# Patient Record
Sex: Female | Born: 1950
Health system: Southern US, Community
[De-identification: ages and names within clinical notes are randomized; demographics above are authoritative.]

## PROBLEM LIST (undated history)

## (undated) DIAGNOSIS — G20 Parkinson's disease: Secondary | ICD-10-CM

## (undated) DIAGNOSIS — F329 Major depressive disorder, single episode, unspecified: Secondary | ICD-10-CM

## (undated) DIAGNOSIS — G4730 Sleep apnea, unspecified: Secondary | ICD-10-CM

## (undated) DIAGNOSIS — G20A1 Parkinson's disease without dyskinesia, without mention of fluctuations: Secondary | ICD-10-CM

## (undated) DIAGNOSIS — E119 Type 2 diabetes mellitus without complications: Secondary | ICD-10-CM

## (undated) DIAGNOSIS — I10 Essential (primary) hypertension: Secondary | ICD-10-CM

## (undated) DIAGNOSIS — Z90710 Acquired absence of both cervix and uterus: Secondary | ICD-10-CM

## (undated) DIAGNOSIS — C801 Malignant (primary) neoplasm, unspecified: Secondary | ICD-10-CM

## (undated) DIAGNOSIS — R609 Edema, unspecified: Secondary | ICD-10-CM

## (undated) DIAGNOSIS — F32A Depression, unspecified: Secondary | ICD-10-CM

## (undated) DIAGNOSIS — R600 Localized edema: Secondary | ICD-10-CM

## (undated) DIAGNOSIS — J45909 Unspecified asthma, uncomplicated: Secondary | ICD-10-CM

## (undated) HISTORY — DX: Sleep apnea, unspecified: G47.30

## (undated) HISTORY — DX: Malignant (primary) neoplasm, unspecified: C80.1

## (undated) HISTORY — DX: Depression, unspecified: F32.A

## (undated) HISTORY — PX: TONSILLECTOMY: SUR1361

## (undated) HISTORY — DX: Type 2 diabetes mellitus without complications: E11.9

## (undated) HISTORY — PX: BREAST LUMPECTOMY: SHX2

## (undated) HISTORY — DX: Localized edema: R60.0

## (undated) HISTORY — DX: Acquired absence of both cervix and uterus: Z90.710

## (undated) HISTORY — PX: OTHER SURGICAL HISTORY: SHX169

## (undated) HISTORY — DX: Major depressive disorder, single episode, unspecified: F32.9

## (undated) HISTORY — DX: Unspecified asthma, uncomplicated: J45.909

## (undated) HISTORY — DX: Edema, unspecified: R60.9

## (undated) HISTORY — DX: Parkinson's disease without dyskinesia, without mention of fluctuations: G20.A1

## (undated) HISTORY — DX: Essential (primary) hypertension: I10

## (undated) HISTORY — DX: Parkinson's disease: G20

---

## 2016-05-07 DIAGNOSIS — C50919 Malignant neoplasm of unspecified site of unspecified female breast: Secondary | ICD-10-CM | POA: Insufficient documentation

## 2018-12-21 DIAGNOSIS — Z853 Personal history of malignant neoplasm of breast: Secondary | ICD-10-CM | POA: Diagnosis not present

## 2018-12-21 DIAGNOSIS — R531 Weakness: Secondary | ICD-10-CM | POA: Diagnosis not present

## 2018-12-21 DIAGNOSIS — M79671 Pain in right foot: Secondary | ICD-10-CM | POA: Diagnosis not present

## 2018-12-21 DIAGNOSIS — N3000 Acute cystitis without hematuria: Secondary | ICD-10-CM | POA: Diagnosis not present

## 2018-12-21 DIAGNOSIS — R0902 Hypoxemia: Secondary | ICD-10-CM | POA: Diagnosis not present

## 2018-12-21 DIAGNOSIS — E860 Dehydration: Secondary | ICD-10-CM | POA: Diagnosis not present

## 2018-12-21 DIAGNOSIS — B9689 Other specified bacterial agents as the cause of diseases classified elsewhere: Secondary | ICD-10-CM | POA: Diagnosis not present

## 2018-12-21 DIAGNOSIS — R279 Unspecified lack of coordination: Secondary | ICD-10-CM | POA: Diagnosis not present

## 2018-12-21 DIAGNOSIS — Z743 Need for continuous supervision: Secondary | ICD-10-CM | POA: Diagnosis not present

## 2018-12-21 DIAGNOSIS — R5381 Other malaise: Secondary | ICD-10-CM | POA: Diagnosis not present

## 2018-12-21 DIAGNOSIS — I10 Essential (primary) hypertension: Secondary | ICD-10-CM | POA: Diagnosis not present

## 2019-01-19 DIAGNOSIS — G20 Parkinson's disease: Secondary | ICD-10-CM | POA: Diagnosis not present

## 2019-01-19 DIAGNOSIS — I10 Essential (primary) hypertension: Secondary | ICD-10-CM | POA: Diagnosis not present

## 2019-01-19 DIAGNOSIS — C50919 Malignant neoplasm of unspecified site of unspecified female breast: Secondary | ICD-10-CM | POA: Diagnosis not present

## 2019-03-05 ENCOUNTER — Encounter: Payer: Self-pay | Admitting: *Deleted

## 2019-03-19 ENCOUNTER — Ambulatory Visit: Payer: Self-pay | Admitting: Neurology

## 2019-04-03 NOTE — Progress Notes (Signed)
Stephanie Anderson was seen today in the movement disorders clinic for neurologic consultation at the request of Ronita Hipps, MD.  The consultation is for the evaluation of Parkinson's disease.  Patient was apparently previously cared for in [place], but no records are available from prior neurologist.  Patient reports that she was diagnosed with Parkinson's disease about 1.5 years years ago.   Reports that first symptom was tremor in "whole body."  She states that she went to a neurologist in [place] ([facility]) and she was dx with PD.  She was placed on a pill first that caused diarrhea and abdominal pain (not levodopa) - states was taking it once per day for a week.  She was then changed to ropinirole.  She has been on it ever since.    She is currently on ropinirole, 2 mg 3 times per day.  States that physician tried to change her to requip XL, 4 mg but insurance denied it.   Specific Symptoms:  Tremor: Yes.   Family hx of similar:  Yes.  , father with PD Voice: no change Sleep: has OSAS - has cpap but doesn't wear it as "mask too confining"  Vivid Dreams:  No.  Acting out dreams:  No. Wet Pillows: a little bit Postural symptoms:  "my balance is pretty good."  Uses cane most of the time.  Falls?  No. Bradykinesia symptoms: no bradykinesia noted Loss of smell:  No. Loss of taste:  No. Urinary Incontinence:  Yes.  , wears disposable underwear just in case she doesn't make it Difficulty Swallowing:  No. Handwriting, micrographia: "sometimes it is small" Trouble with ADL's:  No.  Trouble buttoning clothing: No. Depression:  No. Memory changes:  Mild (sister puts meds in pill box; pt doesn't drive but never has; lives with sister so sister does bills) Hallucinations:  No.  visual distortions: No. N/V:  No. Lightheaded:  No.  Syncope: No. Diplopia:  No. Dyskinesia:  No. Prior exposure to reglan/antipsychotics: No.  Neuroimaging of the brain has previously  been performed. It is not available to me.  She thinks that she had a CT brain several years ago  PREVIOUS MEDICATIONS: Requip  ALLERGIES:   Allergies  Allergen Reactions   Lasix [Furosemide]     Abdominal pain    CURRENT MEDICATIONS:  Outpatient Encounter Medications as of 04/07/2019  Medication Sig   anastrozole (ARIMIDEX) 1 MG tablet Take 1 mg by mouth daily. 1 oral every morning.   aspirin EC 81 MG tablet Take 81 mg by mouth daily.   atenolol (TENORMIN) 25 MG tablet Take by mouth daily. 1 tab every morning   bimatoprost (LUMIGAN) 0.01 % SOLN 1 drop at bedtime. 1 drop each eye every night   hydrochlorothiazide (HYDRODIURIL) 50 MG tablet Take 50 mg by mouth daily. 1 tablet every morning   LOSARTAN POTASSIUM PO Take 50 mg by mouth. 1 tab every morning   metFORMIN (GLUCOPHAGE) 1000 MG tablet Take 1,000 mg by mouth 2 (two) times daily with a meal. 1 tab by mouth twice daily   Multiple Vitamins-Minerals (MULTIVITAMIN ADULT PO) Take by mouth. Vit.C, D3, fish oil, calcium, iron, b12   Omega-3 Fatty Acids (FISH OIL) 1000 MG CAPS Take by mouth.   rOPINIRole (REQUIP) 2 MG tablet Take 2 mg by mouth at bedtime. 1 tab three times daily   sertraline (ZOLOFT) 50 MG tablet Take 50 mg by mouth daily. 1 tab every evening.   simvastatin (ZOCOR)  20 MG tablet Take 20 mg by mouth daily. 1 oral every evening   No facility-administered encounter medications on file as of 04/07/2019.     PAST MEDICAL HISTORY:   Past Medical History:  Diagnosis Date   breast cancer    Depression    Diabetes mellitus without complication (HCC)    H/O: hysterectomy    Hypertension    Parkinson's disease ([place])    Peripheral edema    Sleep apnea     PAST SURGICAL HISTORY:   Past Surgical History:  Procedure Laterality Date   BREAST LUMPECTOMY     left knee replacement     TONSILLECTOMY      SOCIAL HISTORY:   Social History   Socioeconomic History   Marital status: Single    Spouse  name: Not on file   Number of children: Not on file   Years of education: Not on file   Highest education level: Not on file  Occupational History   Occupation: RETIRED  Social Designer, fashion/clothing strain: Not on file   Food insecurity:    Worry: Not on file    Inability: Not on file   Transportation needs:    Medical: Not on file    Non-medical: Not on file  Tobacco Use   Smoking status: Never Smoker   Smokeless tobacco: Never Used  Substance and Sexual Activity   Alcohol use: Never    Frequency: Never   Drug use: Never   Sexual activity: Not on file  Lifestyle   Physical activity:    Days per week: Not on file    Minutes per session: Not on file   Stress: Not on file  Relationships   Social connections:    Talks on phone: Not on file    Gets together: Not on file    Attends religious service: Not on file    Active member of club or organization: Not on file    Attends meetings of clubs or organizations: Not on file    Relationship status: Not on file   Intimate partner violence:    Fear of current or ex partner: Not on file    Emotionally abused: Not on file    Physically abused: Not on file    Forced sexual activity: Not on file  Other Topics Concern   Not on file  Social History Narrative   Not on file    FAMILY HISTORY:   Family Status  Relation Name Status   Mother  Deceased   Father  Deceased   Sister  Alive    ROS:  Review of Systems  Constitutional: Negative.   HENT: Negative.   Eyes: Negative.   Respiratory: Negative.   Cardiovascular: Negative.   Gastrointestinal: Negative.   Genitourinary: Negative.   Musculoskeletal: Negative.   Skin: Negative.   Endo/Heme/Allergies: Negative.     PHYSICAL EXAMINATION:    VITALS:   Vitals:   04/07/19 0918  BP: (!) 142/85  Pulse: 85  Temp: 98.4 F (36.9 C)  SpO2: 95%  Weight: 231 lb 6.4 oz (105 kg)  Height: 5\' 6"  (1.676 m)    GEN:  The patient appears stated age  and is in NAD. HEENT:  Normocephalic, atraumatic.  The mucous membranes are moist. The superficial temporal arteries are without ropiness or tenderness. CV:  RRR Lungs:  CTAB Neck/HEME:  There are no carotid bruits bilaterally.  Neurological examination:  Orientation: The patient is alert and oriented x3. Fund of knowledge  is appropriate.  Recent and remote memory are intact.  Attention and concentration are normal.    Able to name objects and repeat phrases. Cranial nerves: There is good facial symmetry.  There is no facial hypomania.  Extraocular muscles are intact. The visual fields are full to confrontational testing. The speech is fluent and clear. Soft palate rises symmetrically and there is no tongue deviation. Hearing is intact to conversational tone. Sensation: Sensation is intact to light and pinprick throughout (facial, trunk, extremities). Vibration is intact at the bilateral big toe. There is no extinction with double simultaneous stimulation. There is no sensory dermatomal level identified. Motor: Strength is 5/5 in the bilateral upper and lower extremities.   Shoulder shrug is equal and symmetric.  There is no pronator drift. Deep tendon reflexes: not tested  Movement examination: Tone: There is normal tone in the UE/LE Abnormal movements: there is RUE rest tremor that is distractible.  It varies in amplitude and frequency.  It changes when asked to tap out a beat with the opposite hand and will match the beat being asked to tap out. Coordination:  There is no decremation with RAM's, with any form of RAMS, including alternating supination and pronation of the forearm, hand opening and closing, finger taps, heel taps and toe taps bilaterally Gait and Station: The patient has no difficulty arising out of a deep-seated chair without the use of the hands. The patient's stride length is normal, but slow.  She walks with the cane in the right hand.    I have reviewed lab work made  available to me.  It was done December 21, 2018.  White blood cells were 9.2, hemoglobin 10.6, hematocrit 31.9 and platelets 168.  Sodium was 136, potassium 3.9, chloride 94, CO2 27, BUN 27, creatinine 1.6, glucose 129.  ASSESSMENT/PLAN:  1.  Rest tremor, with prior diagnosis of Parkinson's disease  -Long discussion with the patient today.  She currently does not meet [place] brain bank criteria for the diagnosis of Parkinson's disease, and she has not taken her ropinirole today.  I am concerned that the patient potentially has a misdiagnosis.  Her tremor was distractible.  I think a DaTscan could potentially be of value, and she was agreeable.  We discussed that this is not a diagnostic or therapeutic scan.  I am going to have her hold her sertraline for 10 days prior to the test.  She actually states that she has no depression, and was started on it because her sister told the physician that she was depressed, even though she denied it.  She will restart the sertraline following the scan.  She was given patient education on the [place].  -Patient's sister asked me to fill out her handicap placard form, but given the questionable diagnosis, I really did not feel comfortable doing so and did not fill that out today.  -I will try to get a copy of the patient's prior neurology records from [place].  2.  Sleep apnea  -Patient currently not wearing CPAP.  Discussed that she really needs to get back to doing so on a nightly basis.  She has been off of it for about a month.  3.  We will see her back in follow-up after the DaTscan has been completed.  Much greater than 50% of this visit was spent in counseling and coordinating care.  Total face to face time:  45 min     Cc:  Ronita Hipps, MD

## 2019-04-07 ENCOUNTER — Ambulatory Visit (INDEPENDENT_AMBULATORY_CARE_PROVIDER_SITE_OTHER): Payer: Medicare Other | Admitting: Neurology

## 2019-04-07 ENCOUNTER — Encounter: Payer: Self-pay | Admitting: Neurology

## 2019-04-07 ENCOUNTER — Other Ambulatory Visit: Payer: Self-pay

## 2019-04-07 VITALS — BP 142/85 | HR 85 | Temp 98.4°F | Ht 66.0 in | Wt 231.4 lb

## 2019-04-07 DIAGNOSIS — R251 Tremor, unspecified: Secondary | ICD-10-CM | POA: Diagnosis not present

## 2019-04-07 DIAGNOSIS — G4733 Obstructive sleep apnea (adult) (pediatric): Secondary | ICD-10-CM | POA: Diagnosis not present

## 2019-04-07 NOTE — Patient Instructions (Signed)
We will schedule you for DaT scan.  As a reminder, a DaT scan is not a diagnostic scan but will give [place] information.  Today, you did NOT meet diagnostic/clinical criteria for the diagnosis of Parkinson's disease and you had not yet taken your medication, which is why I would like to look at the scan.  You will need to hold your sertraline for 10 days prior to the scan and restart it after the scan.  I would recommend that you wear your cpap nightly.  It was really good meeting you today!

## 2019-04-09 ENCOUNTER — Telehealth: Payer: Self-pay | Admitting: Neurology

## 2019-04-09 NOTE — Telephone Encounter (Signed)
Reviewed an extensive number of records received from patient's prior neurologist, Dr. Renaldo Reel.  Patient first saw him on April 15, 2017 with a 20-year history of progressive tremor.  Patient had already tried propranolol, 60 mg 3 times per day by the primary care physician.  Dr. Avel Peace felt that the patient had a Parkinson's disease that was a "slow progression over the past 20 years."  Patient was started on rasagiline.  This was done on May 22, 2017.  The patient did not tolerate this.  She was switched to ropinirole in July 31, 2017 and started at 1 mg 3 times per day.  She had a nocturnal polysomnogram on June 21, 2017.  Her apnea hypopnea index was 47.3.  Oxygen saturations were down to 80%.  She was started on CPAP.  She had an MRI of the brain on May 08, 2017.  She had chronic small vessel disease.  She was last seen in [place] by the physician assistant.

## 2019-04-13 ENCOUNTER — Other Ambulatory Visit: Payer: Self-pay

## 2019-04-13 DIAGNOSIS — R251 Tremor, unspecified: Secondary | ICD-10-CM

## 2019-04-13 NOTE — Progress Notes (Signed)
Theodosia Quay sent to Ranae Plumber, CMA        The order you placed was correct, I have scheduled the patient for July 2nd. I spoke with the patient.   Thanks,   [employer]

## 2019-04-13 NOTE — Progress Notes (Signed)
Received PA for patient [place] placed Order/ PA approval letter sent to [facility]  Staff message also sent as notification

## 2019-04-30 ENCOUNTER — Other Ambulatory Visit: Payer: Self-pay

## 2019-04-30 ENCOUNTER — Ambulatory Visit (HOSPITAL_COMMUNITY)
Admission: RE | Admit: 2019-04-30 | Discharge: 2019-04-30 | Disposition: A | Discharge status: Home / Self Care | Payer: Medicare Other | Source: Ambulatory Visit | Attending: Neurology | Admitting: Neurology

## 2019-04-30 ENCOUNTER — Encounter (HOSPITAL_COMMUNITY)
Admission: RE | Admit: 2019-04-30 | Discharge: 2019-04-30 | Disposition: A | Discharge status: Home / Self Care | Payer: Medicare Other | Source: Ambulatory Visit | Attending: Neurology | Admitting: Neurology

## 2019-04-30 DIAGNOSIS — R251 Tremor, unspecified: Secondary | ICD-10-CM | POA: Insufficient documentation

## 2019-04-30 MED ORDER — IOFLUPANE I 123 185 MBQ/2.5ML IV SOLN
4.7300 | Freq: Once | INTRAVENOUS | Status: AC | PRN
  Administered 2019-04-30: 12:00:00 4.73 via INTRAVENOUS
  Filled 2019-04-30: qty 5

## 2019-04-30 MED ORDER — IODINE STRONG (LUGOLS) 5 % PO SOLN
0.8000 mL | Freq: Once | ORAL | Status: AC
  Administered 2019-04-30: 0.8 mL via ORAL

## 2019-05-04 ENCOUNTER — Telehealth: Payer: Self-pay | Admitting: Neurology

## 2019-05-04 NOTE — Telephone Encounter (Signed)
Reviewed and agree with DaT scan results.  Hinton Dyer, please put her in 7/15 at 11:15 unless she wants an evisit and then can do this Friday but I personally would rather see her at the in person visit.

## 2019-05-04 NOTE — Telephone Encounter (Signed)
I called and left her a message to please call the office back to get scheduled for a follow up with Dr Tat

## 2019-05-07 NOTE — Progress Notes (Signed)
Virtual Visit via Video Note The purpose of this virtual visit is to provide medical care while limiting exposure to the novel coronavirus.    Consent was obtained for video visit:  Yes.   Answered questions that patient had about telehealth interaction:  Yes.   I discussed the limitations, risks, security and privacy concerns of performing an evaluation and management service by telemedicine. I also discussed with the patient that there may be a patient responsible charge related to this service. The patient expressed understanding and agreed to proceed.  Pt location: Home Physician Location: home Name of referring provider:  Ronita Hipps, MD I connected with Stephanie Anderson at patients initiation/request on 05/08/2019 at  1:00 PM EDT by video enabled telemedicine application and verified that I am speaking with the correct person using two identifiers. Pt MRN:  [id number] Pt DOB:  1951/08/15 Video Participants:  Stephanie Anderson;  Sister Vicente Males accompanies patient and supplements the history.   History of Present Illness:  Patient seen today for follow-up of tremor.  Patient came to me previously diagnosed with Parkinson's disease, but she did not meet the criteria for this diagnosis when I saw her last visit and I was concerned about a misdiagnosis.  She had a DaTscan completed and I had the opportunity to review this.  This was done on April 30, 2019.  DaTscan was normal, which would be inconsistent with Parkinson's disease.  She is still on Requip, 2 mg 3 times per day.  I did have the opportunity since our last visit to review an extensive number of records from her prior neurologist.  She first saw him on April 15, 2017 with a reported 20-year history of progressive tremor.  Patient had already been on propranolol, 60 mg 3 times per day.  The neurologist that she saw (Dr. Avel Peace) felt that she had Parkinson's disease that was a "slow progression over the past 20 years."  She was for started on  rasagiline but did not tolerate that.  She was changed to ropinirole in October, 2013.  Of note is that the patient did have a nocturnal polysomnogram in August, 2018.  Apnea hypopnea index was 47.3.  She was started on CPAP, the patient admits that she is not wearing it any longer.  MRI of the brain was completed on May 08, 2017 with evidence of chronic small vessel disease.   Current Outpatient Medications on File Prior to Visit  Medication Sig Dispense Refill   anastrozole (ARIMIDEX) 1 MG tablet Take 1 mg by mouth daily. 1 oral every morning.     aspirin EC 81 MG tablet Take 81 mg by mouth daily.     atenolol (TENORMIN) 25 MG tablet Take by mouth daily. 1 tab every morning     bimatoprost (LUMIGAN) 0.01 % SOLN 1 drop at bedtime. 1 drop each eye every night     hydrochlorothiazide (HYDRODIURIL) 50 MG tablet Take 50 mg by mouth daily. 1 tablet every morning     LOSARTAN POTASSIUM PO Take 50 mg by mouth. 1 tab every morning     metFORMIN (GLUCOPHAGE) 1000 MG tablet Take 1,000 mg by mouth 2 (two) times daily with a meal. 1 tab by mouth twice daily     Multiple Vitamins-Minerals (MULTIVITAMIN ADULT PO) Take by mouth. Vit.C, D3, fish oil, calcium, iron, b12     Omega-3 Fatty Acids (FISH OIL) 1000 MG CAPS Take by mouth.     rOPINIRole (REQUIP) 2 MG tablet Take  2 mg by mouth at bedtime. 1 tab three times daily     sertraline (ZOLOFT) 50 MG tablet Take 50 mg by mouth daily. 1 tab every evening.     simvastatin (ZOCOR) 20 MG tablet Take 20 mg by mouth daily. 1 oral every evening     No current facility-administered medications on file prior to visit.      Observations/Objective:   Vitals:   05/08/19 1131  Weight: 240 lb (108.9 kg)  Height: 5\' 6"  (1.676 m)   GEN:  The patient appears stated age and is in NAD.  Neurological examination:  Orientation: The patient is alert and oriented x3. Cranial nerves: There is good facial symmetry. There is no facial hypomimia.  The speech is  fluent and clear. Soft palate rises symmetrically and there is no tongue deviation. Hearing is intact to conversational tone. Motor: Strength is at least antigravity x 4.   Shoulder shrug is equal and symmetric.  There is no pronator drift.  Movement examination: Tone: unable Abnormal movements: There is head tremor that is variable in nature (variable in frequency as well as direction).  I was not able to see her hands at rest through the video.  She did not have postural or intention tremor today. Coordination:  There is no decremation with RAM's, with hand opening and closing her finger taps bilaterally. Gait and Station: The patient pushes off to arise.  She was steady today.    Assessment and Plan:   1.  Tremor  -Discussed with the patient that I do not think she has Parkinson's disease.  She does not meet the clinical criteria for the [place] brain bank criteria.  DaTscan done in July, 2020 was also normal.  My suspicion is that the patient actually has psychogenic tremor, based on the fact that last visit her tremor varied in amplitude and frequency and changed when asked to tap out a beat with the opposite hand (entrainment).  Discussed that medications generally do not help this.  Discussed that many times this is associated with past stress/trauma.  Discussed that oftentimes patients are unaware of the stress/trauma and then her sister stated "oh, we know the stress."  I would recommend counseling, and I am happy to referral, but patient sister stated that they would rather get that from the primary care physician in hopes that they would find someone closer to home.    -Patient's sister and likely father (based on history) have essential tremor, but this patients history/physical are not consistent with that and her sister also agrees that her symptoms are different.    -I would recommend weaning and discontinuing the Requip.  Patient and sister both agree with this and sister states that "the  requip has never helped."  We will decrease her Requip to 2 mg twice per day for a week, then 2 mg once per day for a week and then she will discontinue the Requip.  2.  Severe obstructive sleep apnea syndrome  -Discussed morbidity and mortality associated with untreated sleep apnea.  Patient is currently not wearing CPAP.  Addressed the fact that she needs to get back on CPAP, and she states that she will.  Sister states that she needs new tubing.  She will need referred to a home company, but the local home companies will not provide her with supplies until they see her sleep study.  Her sister is going to try to get a copy of that from [place], and then I am  happy to refer her to advanced home care.  Her sister states that they will likely get the referral from primary care.  Follow Up Instructions:  As needed follow-up  -I discussed the assessment and treatment plan with the patient. The patient was provided an opportunity to ask questions and all were answered. The patient agreed with the plan and demonstrated an understanding of the instructions.   The patient was advised to call back or seek an in-person evaluation if the symptoms worsen or if the condition fails to improve as anticipated.    Total Time spent in visit with the patient was:  25 min, of which more than 50% of the time was spent in counseling and/or coordinating care as above.   Pt understands and agrees with the plan of care outlined.     Alonza Bogus, DO

## 2019-05-08 ENCOUNTER — Other Ambulatory Visit: Payer: Self-pay

## 2019-05-08 ENCOUNTER — Encounter: Payer: Self-pay | Admitting: Neurology

## 2019-05-08 ENCOUNTER — Telehealth (INDEPENDENT_AMBULATORY_CARE_PROVIDER_SITE_OTHER): Payer: Medicare Other | Admitting: Neurology

## 2019-05-08 VITALS — Ht 66.0 in | Wt 240.0 lb

## 2019-05-08 DIAGNOSIS — F444 Conversion disorder with motor symptom or deficit: Secondary | ICD-10-CM

## 2019-05-12 DIAGNOSIS — E118 Type 2 diabetes mellitus with unspecified complications: Secondary | ICD-10-CM | POA: Diagnosis not present

## 2019-07-16 DIAGNOSIS — G252 Other specified forms of tremor: Secondary | ICD-10-CM | POA: Diagnosis not present

## 2019-07-23 DIAGNOSIS — C50411 Malignant neoplasm of upper-outer quadrant of right female breast: Secondary | ICD-10-CM | POA: Diagnosis not present

## 2019-07-23 DIAGNOSIS — D649 Anemia, unspecified: Secondary | ICD-10-CM | POA: Diagnosis not present

## 2019-07-23 DIAGNOSIS — Z79811 Long term (current) use of aromatase inhibitors: Secondary | ICD-10-CM | POA: Diagnosis not present

## 2019-07-23 DIAGNOSIS — Z853 Personal history of malignant neoplasm of breast: Secondary | ICD-10-CM | POA: Diagnosis not present

## 2019-07-23 DIAGNOSIS — R251 Tremor, unspecified: Secondary | ICD-10-CM | POA: Diagnosis not present

## 2019-08-13 DIAGNOSIS — G250 Essential tremor: Secondary | ICD-10-CM | POA: Diagnosis not present

## 2019-08-17 DIAGNOSIS — M85852 Other specified disorders of bone density and structure, left thigh: Secondary | ICD-10-CM | POA: Diagnosis not present

## 2019-08-17 DIAGNOSIS — M8589 Other specified disorders of bone density and structure, multiple sites: Secondary | ICD-10-CM | POA: Diagnosis not present

## 2019-09-10 DIAGNOSIS — E118 Type 2 diabetes mellitus with unspecified complications: Secondary | ICD-10-CM | POA: Diagnosis not present

## 2019-09-10 DIAGNOSIS — I10 Essential (primary) hypertension: Secondary | ICD-10-CM | POA: Diagnosis not present

## 2019-09-10 DIAGNOSIS — R928 Other abnormal and inconclusive findings on diagnostic imaging of breast: Secondary | ICD-10-CM | POA: Diagnosis not present

## 2019-09-10 DIAGNOSIS — C50411 Malignant neoplasm of upper-outer quadrant of right female breast: Secondary | ICD-10-CM | POA: Diagnosis not present

## 2019-09-10 DIAGNOSIS — Z23 Encounter for immunization: Secondary | ICD-10-CM | POA: Diagnosis not present

## 2019-09-10 DIAGNOSIS — Z0000 Encounter for general adult medical examination without abnormal findings: Secondary | ICD-10-CM | POA: Diagnosis not present

## 2019-11-12 DIAGNOSIS — G250 Essential tremor: Secondary | ICD-10-CM | POA: Diagnosis not present

## 2019-11-12 DIAGNOSIS — R5383 Other fatigue: Secondary | ICD-10-CM | POA: Diagnosis not present

## 2019-12-10 DIAGNOSIS — Z79899 Other long term (current) drug therapy: Secondary | ICD-10-CM | POA: Diagnosis not present

## 2019-12-10 DIAGNOSIS — E538 Deficiency of other specified B group vitamins: Secondary | ICD-10-CM | POA: Diagnosis not present

## 2019-12-10 DIAGNOSIS — G250 Essential tremor: Secondary | ICD-10-CM | POA: Diagnosis not present

## 2019-12-10 DIAGNOSIS — R5383 Other fatigue: Secondary | ICD-10-CM | POA: Diagnosis not present

## 2019-12-10 DIAGNOSIS — E559 Vitamin D deficiency, unspecified: Secondary | ICD-10-CM | POA: Diagnosis not present

## 2019-12-24 DIAGNOSIS — G250 Essential tremor: Secondary | ICD-10-CM | POA: Diagnosis not present

## 2020-01-06 DIAGNOSIS — T50Z95A Adverse effect of other vaccines and biological substances, initial encounter: Secondary | ICD-10-CM | POA: Diagnosis not present

## 2020-01-06 DIAGNOSIS — I10 Essential (primary) hypertension: Secondary | ICD-10-CM | POA: Diagnosis not present

## 2020-01-06 DIAGNOSIS — Z743 Need for continuous supervision: Secondary | ICD-10-CM | POA: Diagnosis not present

## 2020-01-06 DIAGNOSIS — T881XXA Other complications following immunization, not elsewhere classified, initial encounter: Secondary | ICD-10-CM | POA: Diagnosis not present

## 2020-01-06 DIAGNOSIS — R0789 Other chest pain: Secondary | ICD-10-CM | POA: Diagnosis not present

## 2020-01-06 DIAGNOSIS — I499 Cardiac arrhythmia, unspecified: Secondary | ICD-10-CM | POA: Diagnosis not present

## 2020-01-06 DIAGNOSIS — R079 Chest pain, unspecified: Secondary | ICD-10-CM | POA: Diagnosis not present

## 2020-01-21 DIAGNOSIS — G252 Other specified forms of tremor: Secondary | ICD-10-CM | POA: Diagnosis not present

## 2020-01-26 DIAGNOSIS — E119 Type 2 diabetes mellitus without complications: Secondary | ICD-10-CM | POA: Diagnosis not present

## 2020-02-19 DIAGNOSIS — E118 Type 2 diabetes mellitus with unspecified complications: Secondary | ICD-10-CM | POA: Diagnosis not present

## 2020-02-25 DIAGNOSIS — E118 Type 2 diabetes mellitus with unspecified complications: Secondary | ICD-10-CM | POA: Diagnosis not present

## 2020-02-26 DIAGNOSIS — I10 Essential (primary) hypertension: Secondary | ICD-10-CM | POA: Diagnosis not present

## 2020-02-26 DIAGNOSIS — E7800 Pure hypercholesterolemia, unspecified: Secondary | ICD-10-CM | POA: Diagnosis not present

## 2020-03-17 DIAGNOSIS — G252 Other specified forms of tremor: Secondary | ICD-10-CM | POA: Diagnosis not present

## 2020-03-21 DIAGNOSIS — G4733 Obstructive sleep apnea (adult) (pediatric): Secondary | ICD-10-CM | POA: Diagnosis not present

## 2020-03-21 DIAGNOSIS — R0600 Dyspnea, unspecified: Secondary | ICD-10-CM | POA: Diagnosis not present

## 2020-03-21 DIAGNOSIS — R5383 Other fatigue: Secondary | ICD-10-CM | POA: Diagnosis not present

## 2020-03-28 DIAGNOSIS — E118 Type 2 diabetes mellitus with unspecified complications: Secondary | ICD-10-CM | POA: Diagnosis not present

## 2020-03-28 DIAGNOSIS — I10 Essential (primary) hypertension: Secondary | ICD-10-CM | POA: Diagnosis not present

## 2020-04-02 DIAGNOSIS — G4733 Obstructive sleep apnea (adult) (pediatric): Secondary | ICD-10-CM | POA: Diagnosis not present

## 2020-04-05 DIAGNOSIS — R5383 Other fatigue: Secondary | ICD-10-CM | POA: Diagnosis not present

## 2020-04-05 DIAGNOSIS — G4733 Obstructive sleep apnea (adult) (pediatric): Secondary | ICD-10-CM | POA: Diagnosis not present

## 2020-04-05 DIAGNOSIS — R0600 Dyspnea, unspecified: Secondary | ICD-10-CM | POA: Diagnosis not present

## 2020-04-06 DIAGNOSIS — Z79899 Other long term (current) drug therapy: Secondary | ICD-10-CM | POA: Diagnosis not present

## 2020-04-06 DIAGNOSIS — E7800 Pure hypercholesterolemia, unspecified: Secondary | ICD-10-CM | POA: Diagnosis not present

## 2020-04-06 DIAGNOSIS — E119 Type 2 diabetes mellitus without complications: Secondary | ICD-10-CM | POA: Diagnosis not present

## 2020-04-14 DIAGNOSIS — Z0000 Encounter for general adult medical examination without abnormal findings: Secondary | ICD-10-CM | POA: Diagnosis not present

## 2020-04-14 DIAGNOSIS — E118 Type 2 diabetes mellitus with unspecified complications: Secondary | ICD-10-CM | POA: Diagnosis not present

## 2020-04-23 DIAGNOSIS — G4733 Obstructive sleep apnea (adult) (pediatric): Secondary | ICD-10-CM | POA: Diagnosis not present

## 2020-04-27 DIAGNOSIS — H40013 Open angle with borderline findings, low risk, bilateral: Secondary | ICD-10-CM | POA: Diagnosis not present

## 2020-04-27 DIAGNOSIS — I10 Essential (primary) hypertension: Secondary | ICD-10-CM | POA: Diagnosis not present

## 2020-04-27 DIAGNOSIS — E118 Type 2 diabetes mellitus with unspecified complications: Secondary | ICD-10-CM | POA: Diagnosis not present

## 2020-05-02 DIAGNOSIS — R0600 Dyspnea, unspecified: Secondary | ICD-10-CM | POA: Diagnosis not present

## 2020-05-02 DIAGNOSIS — R5383 Other fatigue: Secondary | ICD-10-CM | POA: Diagnosis not present

## 2020-05-02 DIAGNOSIS — J45998 Other asthma: Secondary | ICD-10-CM | POA: Diagnosis not present

## 2020-05-02 DIAGNOSIS — G4733 Obstructive sleep apnea (adult) (pediatric): Secondary | ICD-10-CM | POA: Diagnosis not present

## 2020-05-11 DIAGNOSIS — G252 Other specified forms of tremor: Secondary | ICD-10-CM | POA: Diagnosis not present

## 2020-05-12 DIAGNOSIS — L602 Onychogryphosis: Secondary | ICD-10-CM | POA: Diagnosis not present

## 2020-05-12 DIAGNOSIS — Z7984 Long term (current) use of oral hypoglycemic drugs: Secondary | ICD-10-CM | POA: Diagnosis not present

## 2020-05-12 DIAGNOSIS — E119 Type 2 diabetes mellitus without complications: Secondary | ICD-10-CM | POA: Diagnosis not present

## 2020-05-28 DIAGNOSIS — I10 Essential (primary) hypertension: Secondary | ICD-10-CM | POA: Diagnosis not present

## 2020-05-28 DIAGNOSIS — E119 Type 2 diabetes mellitus without complications: Secondary | ICD-10-CM | POA: Diagnosis not present

## 2020-06-26 DIAGNOSIS — I499 Cardiac arrhythmia, unspecified: Secondary | ICD-10-CM | POA: Diagnosis not present

## 2020-06-26 DIAGNOSIS — R1084 Generalized abdominal pain: Secondary | ICD-10-CM | POA: Diagnosis not present

## 2020-06-26 DIAGNOSIS — R6889 Other general symptoms and signs: Secondary | ICD-10-CM | POA: Diagnosis not present

## 2020-06-26 DIAGNOSIS — R109 Unspecified abdominal pain: Secondary | ICD-10-CM | POA: Diagnosis not present

## 2020-06-26 DIAGNOSIS — R1110 Vomiting, unspecified: Secondary | ICD-10-CM | POA: Diagnosis not present

## 2020-06-26 DIAGNOSIS — G20 Parkinson's disease: Secondary | ICD-10-CM | POA: Diagnosis not present

## 2020-06-26 DIAGNOSIS — E162 Hypoglycemia, unspecified: Secondary | ICD-10-CM | POA: Diagnosis not present

## 2020-06-26 DIAGNOSIS — Z7984 Long term (current) use of oral hypoglycemic drugs: Secondary | ICD-10-CM | POA: Diagnosis not present

## 2020-06-26 DIAGNOSIS — R112 Nausea with vomiting, unspecified: Secondary | ICD-10-CM | POA: Diagnosis not present

## 2020-06-26 DIAGNOSIS — K5730 Diverticulosis of large intestine without perforation or abscess without bleeding: Secondary | ICD-10-CM | POA: Diagnosis not present

## 2020-06-26 DIAGNOSIS — Z79899 Other long term (current) drug therapy: Secondary | ICD-10-CM | POA: Diagnosis not present

## 2020-06-26 DIAGNOSIS — Z743 Need for continuous supervision: Secondary | ICD-10-CM | POA: Diagnosis not present

## 2020-06-26 DIAGNOSIS — J90 Pleural effusion, not elsewhere classified: Secondary | ICD-10-CM | POA: Diagnosis not present

## 2020-06-26 DIAGNOSIS — I10 Essential (primary) hypertension: Secondary | ICD-10-CM | POA: Diagnosis not present

## 2020-06-26 DIAGNOSIS — E161 Other hypoglycemia: Secondary | ICD-10-CM | POA: Diagnosis not present

## 2020-06-26 DIAGNOSIS — I700 Atherosclerosis of aorta: Secondary | ICD-10-CM | POA: Diagnosis not present

## 2020-06-26 DIAGNOSIS — R197 Diarrhea, unspecified: Secondary | ICD-10-CM | POA: Diagnosis not present

## 2020-06-26 DIAGNOSIS — R531 Weakness: Secondary | ICD-10-CM | POA: Diagnosis not present

## 2020-06-26 DIAGNOSIS — E119 Type 2 diabetes mellitus without complications: Secondary | ICD-10-CM | POA: Diagnosis not present

## 2020-06-26 DIAGNOSIS — Z7982 Long term (current) use of aspirin: Secondary | ICD-10-CM | POA: Diagnosis not present

## 2020-06-28 DIAGNOSIS — I10 Essential (primary) hypertension: Secondary | ICD-10-CM | POA: Diagnosis not present

## 2020-06-28 DIAGNOSIS — E118 Type 2 diabetes mellitus with unspecified complications: Secondary | ICD-10-CM | POA: Diagnosis not present

## 2020-06-29 DIAGNOSIS — Z9189 Other specified personal risk factors, not elsewhere classified: Secondary | ICD-10-CM | POA: Diagnosis not present

## 2020-06-29 DIAGNOSIS — G252 Other specified forms of tremor: Secondary | ICD-10-CM | POA: Diagnosis not present

## 2020-07-01 DIAGNOSIS — K591 Functional diarrhea: Secondary | ICD-10-CM | POA: Diagnosis not present

## 2020-07-07 DIAGNOSIS — G4733 Obstructive sleep apnea (adult) (pediatric): Secondary | ICD-10-CM | POA: Diagnosis not present

## 2020-07-07 DIAGNOSIS — G4710 Hypersomnia, unspecified: Secondary | ICD-10-CM | POA: Diagnosis not present

## 2020-07-08 DIAGNOSIS — R197 Diarrhea, unspecified: Secondary | ICD-10-CM | POA: Diagnosis not present

## 2020-07-11 DIAGNOSIS — Z79899 Other long term (current) drug therapy: Secondary | ICD-10-CM | POA: Diagnosis not present

## 2020-07-11 DIAGNOSIS — E118 Type 2 diabetes mellitus with unspecified complications: Secondary | ICD-10-CM | POA: Diagnosis not present

## 2020-07-20 DIAGNOSIS — G252 Other specified forms of tremor: Secondary | ICD-10-CM | POA: Diagnosis not present

## 2020-07-28 DIAGNOSIS — I10 Essential (primary) hypertension: Secondary | ICD-10-CM | POA: Diagnosis not present

## 2020-07-28 DIAGNOSIS — E118 Type 2 diabetes mellitus with unspecified complications: Secondary | ICD-10-CM | POA: Diagnosis not present

## 2020-08-06 DIAGNOSIS — G4733 Obstructive sleep apnea (adult) (pediatric): Secondary | ICD-10-CM | POA: Diagnosis not present

## 2020-08-08 DIAGNOSIS — G4710 Hypersomnia, unspecified: Secondary | ICD-10-CM | POA: Diagnosis not present

## 2020-08-08 DIAGNOSIS — G4733 Obstructive sleep apnea (adult) (pediatric): Secondary | ICD-10-CM | POA: Diagnosis not present

## 2020-08-17 DIAGNOSIS — G252 Other specified forms of tremor: Secondary | ICD-10-CM | POA: Diagnosis not present

## 2020-08-18 DIAGNOSIS — R0600 Dyspnea, unspecified: Secondary | ICD-10-CM | POA: Diagnosis not present

## 2020-08-18 DIAGNOSIS — G4733 Obstructive sleep apnea (adult) (pediatric): Secondary | ICD-10-CM | POA: Diagnosis not present

## 2020-08-18 DIAGNOSIS — R5383 Other fatigue: Secondary | ICD-10-CM | POA: Diagnosis not present

## 2020-08-18 DIAGNOSIS — Z23 Encounter for immunization: Secondary | ICD-10-CM | POA: Diagnosis not present

## 2020-08-27 DIAGNOSIS — I10 Essential (primary) hypertension: Secondary | ICD-10-CM | POA: Diagnosis not present

## 2020-08-27 DIAGNOSIS — E118 Type 2 diabetes mellitus with unspecified complications: Secondary | ICD-10-CM | POA: Diagnosis not present

## 2020-09-06 DIAGNOSIS — G4733 Obstructive sleep apnea (adult) (pediatric): Secondary | ICD-10-CM | POA: Diagnosis not present

## 2020-09-07 DIAGNOSIS — G4733 Obstructive sleep apnea (adult) (pediatric): Secondary | ICD-10-CM | POA: Diagnosis not present

## 2020-09-07 DIAGNOSIS — G4710 Hypersomnia, unspecified: Secondary | ICD-10-CM | POA: Diagnosis not present

## 2020-09-13 DIAGNOSIS — E119 Type 2 diabetes mellitus without complications: Secondary | ICD-10-CM | POA: Diagnosis not present

## 2020-09-13 DIAGNOSIS — L84 Corns and callosities: Secondary | ICD-10-CM | POA: Diagnosis not present

## 2020-09-13 DIAGNOSIS — L600 Ingrowing nail: Secondary | ICD-10-CM | POA: Diagnosis not present

## 2020-09-21 DIAGNOSIS — R928 Other abnormal and inconclusive findings on diagnostic imaging of breast: Secondary | ICD-10-CM | POA: Diagnosis not present

## 2020-09-21 DIAGNOSIS — C50411 Malignant neoplasm of upper-outer quadrant of right female breast: Secondary | ICD-10-CM | POA: Diagnosis not present

## 2020-09-29 DIAGNOSIS — G252 Other specified forms of tremor: Secondary | ICD-10-CM | POA: Diagnosis not present

## 2020-10-06 DIAGNOSIS — G4733 Obstructive sleep apnea (adult) (pediatric): Secondary | ICD-10-CM | POA: Diagnosis not present

## 2020-10-12 DIAGNOSIS — H40013 Open angle with borderline findings, low risk, bilateral: Secondary | ICD-10-CM | POA: Diagnosis not present

## 2020-10-13 DIAGNOSIS — R0600 Dyspnea, unspecified: Secondary | ICD-10-CM | POA: Diagnosis not present

## 2020-10-13 DIAGNOSIS — G4733 Obstructive sleep apnea (adult) (pediatric): Secondary | ICD-10-CM | POA: Diagnosis not present

## 2020-10-13 DIAGNOSIS — G4710 Hypersomnia, unspecified: Secondary | ICD-10-CM | POA: Diagnosis not present

## 2020-10-17 DIAGNOSIS — I10 Essential (primary) hypertension: Secondary | ICD-10-CM | POA: Diagnosis not present

## 2020-10-17 DIAGNOSIS — E118 Type 2 diabetes mellitus with unspecified complications: Secondary | ICD-10-CM | POA: Diagnosis not present

## 2020-10-17 DIAGNOSIS — C50919 Malignant neoplasm of unspecified site of unspecified female breast: Secondary | ICD-10-CM | POA: Diagnosis not present

## 2020-10-27 DIAGNOSIS — E118 Type 2 diabetes mellitus with unspecified complications: Secondary | ICD-10-CM | POA: Diagnosis not present

## 2020-10-27 DIAGNOSIS — I10 Essential (primary) hypertension: Secondary | ICD-10-CM | POA: Diagnosis not present

## 2020-10-27 DIAGNOSIS — R0600 Dyspnea, unspecified: Secondary | ICD-10-CM | POA: Diagnosis not present

## 2020-10-27 DIAGNOSIS — G4733 Obstructive sleep apnea (adult) (pediatric): Secondary | ICD-10-CM | POA: Diagnosis not present

## 2020-10-31 ENCOUNTER — Encounter: Payer: Self-pay | Admitting: Oncology

## 2020-11-03 DIAGNOSIS — R0600 Dyspnea, unspecified: Secondary | ICD-10-CM | POA: Diagnosis not present

## 2020-11-03 DIAGNOSIS — I517 Cardiomegaly: Secondary | ICD-10-CM | POA: Diagnosis not present

## 2020-11-06 DIAGNOSIS — G4733 Obstructive sleep apnea (adult) (pediatric): Secondary | ICD-10-CM | POA: Diagnosis not present

## 2020-11-24 DIAGNOSIS — G4733 Obstructive sleep apnea (adult) (pediatric): Secondary | ICD-10-CM | POA: Diagnosis not present

## 2020-11-24 DIAGNOSIS — R0600 Dyspnea, unspecified: Secondary | ICD-10-CM | POA: Diagnosis not present

## 2020-11-27 DIAGNOSIS — I10 Essential (primary) hypertension: Secondary | ICD-10-CM | POA: Diagnosis not present

## 2020-11-27 DIAGNOSIS — E118 Type 2 diabetes mellitus with unspecified complications: Secondary | ICD-10-CM | POA: Diagnosis not present

## 2020-11-30 DIAGNOSIS — G252 Other specified forms of tremor: Secondary | ICD-10-CM | POA: Diagnosis not present

## 2020-12-06 NOTE — Progress Notes (Signed)
[facility]  [address] [place],  [place]  [address] [phone]  Clinic Day:  12/08/2020  Referring physician: Ronita Hipps, MD  This document serves as a record of services personally performed by Hosie Poisson, MD. It was created on their behalf by Curry,Lauren E, a trained medical scribe. The creation of this record is based on the scribe's personal observations and the provider's statements to them.  CHIEF COMPLAINT:  CC: History of stage IIA invasive ductal carcinoma of the right breast  Current Treatment:  Anastrozole 1 mg daily for a total of 5 years   HISTORY OF PRESENT ILLNESS:  Stephanie Anderson is a 70 y.o. female with a history of stage IIA (T2N0M0) invasive ductal carcinoma in the upper outer quadrant of the right breast in July 2017.  This was treated with lumpectomy then adjuvant radiation, and once completed, she was placed on anastrozole.  She has been on anastrozole for over 4 years now.  She has not undergone genetic testing.  She transferred her care after moving from [place] last October 2020.  She underwent menarche around age 59, and went through menopause in her 55s.  She was never placed on hormones.  She has never had children. Bone density from October 2020 revealed osteopenia of the hip.    INTERVAL HISTORY:  Stephanie Anderson is here for follow up and continues to have shakes and tremors and uses a cane to ambulate.  Otherwise, she has been well.  Annual bilateral mammogram from November was clear.  She continues anastrozole daily without significant difficulty, and is scheduled to complete 5 years of therapy in September.  Her  appetite is good, and she has lost nearly 5 pounds since her last visit.  She denies fever, chills or other signs of infection.  She denies nausea, vomiting, bowel issues, or abdominal pain.  She denies sore throat, cough, dyspnea, or chest pain.  REVIEW OF SYSTEMS:  Review of Systems   Constitutional: Negative.   HENT:  Negative.   Eyes: Negative.   Respiratory: Negative.   Cardiovascular: Negative.   Gastrointestinal: Negative.   Endocrine: Negative.   Genitourinary: Negative.    Musculoskeletal: Negative.   Skin: Negative.   Neurological:       Tremors and shakes  Hematological: Negative.   Psychiatric/Behavioral: Negative.      VITALS:  Blood pressure (!) 174/80, pulse 71, temperature 98.4 F (36.9 C), temperature source Oral, resp. rate 18, height 5\' 6"  (1.676 m), weight 246 lb 3.2 oz (111.7 kg), SpO2 99 %.  Wt Readings from Last 3 Encounters:  12/08/20 246 lb 3.2 oz (111.7 kg)  05/08/19 240 lb (108.9 kg)  04/07/19 231 lb 6.4 oz (105 kg)    Body mass index is 39.74 kg/m.  Performance status (ECOG): 1 - Symptomatic but completely ambulatory  PHYSICAL EXAM:  Physical Exam Constitutional:      General: She is not in acute distress.    Appearance: Normal appearance. She is normal weight.  HENT:     Head: Normocephalic and atraumatic.  Eyes:     General: No scleral icterus.    Extraocular Movements: Extraocular movements intact.     Conjunctiva/sclera: Conjunctivae normal.     Pupils: Pupils are equal, round, and reactive to light.  Cardiovascular:     Rate and Rhythm: Normal rate and regular rhythm.     Pulses: Normal pulses.     Heart sounds: Murmur heard.   Systolic murmur is present with  a grade of 2/6. No friction rub. No gallop.   Pulmonary:     Effort: Pulmonary effort is normal. No respiratory distress.     Breath sounds: Normal breath sounds.  Chest:  Breasts:     Right: Normal.     Left: Normal.      Comments: Deep scar in the upper outer quadrant of the right breast, which is well healed. No masses in either breast. Abdominal:     General: Bowel sounds are normal. There is no distension.     Palpations: Abdomen is soft. There is no mass.     Tenderness: There is no abdominal tenderness.  Musculoskeletal:        General:  Normal range of motion.     Cervical back: Normal range of motion and neck supple.     Right lower leg: No edema.     Left lower leg: No edema.  Lymphadenopathy:     Cervical: No cervical adenopathy.  Skin:    General: Skin is warm and dry.  Neurological:     General: No focal deficit present.     Mental Status: She is alert and oriented to person, place, and time. Mental status is at baseline.  Psychiatric:        Mood and Affect: Mood normal.        Behavior: Behavior normal.        Thought Content: Thought content normal.        Judgment: Judgment normal.     LABS:  No flowsheet data found. No flowsheet data found.   STUDIES:   She underwent a digital diagnostic bilateral mammogram with tomography on 09/21/2020 showing: breast density category B.  Stable right lumpectomy site. No evidence of malignancy in either breast.   Allergies:  Allergies  Allergen Reactions  . Furosemide Other (See Comments)    Abdominal pain Abdominal pain    Current Medications: Current Outpatient Medications  Medication Sig Dispense Refill  . albuterol (VENTOLIN HFA) 108 (90 Base) MCG/ACT inhaler Inhale into the lungs every 4 (four) hours as needed for wheezing or shortness of breath.    . anastrozole (ARIMIDEX) 1 MG tablet Take 1 mg by mouth daily. 1 oral every morning.    Marland Kitchen atenolol (TENORMIN) 25 MG tablet Take by mouth daily. 1 tab every morning    . budesonide (PULMICORT) 0.5 MG/2ML nebulizer solution SMARTSIG:1 Vial(s) Via Nebulizer Every 12 Hours    . hydrochlorothiazide (HYDRODIURIL) 50 MG tablet Take 50 mg by mouth daily. 1 tablet every morning    . losartan (COZAAR) 50 MG tablet Take 50 mg by mouth every morning.    . potassium chloride (KLOR-CON) 10 MEQ tablet Take 10 mEq by mouth daily.    . rosuvastatin (CRESTOR) 20 MG tablet Take 20 mg by mouth at bedtime.    . simvastatin (ZOCOR) 20 MG tablet Take 20 mg by mouth daily. 1 oral every evening     No current  facility-administered medications for this visit.     ASSESSMENT & PLAN:   Assessment:   1.  History of stage II (T2N0M0) invasive ductal carcinoma in 2017, treated with lumpectomy and radiation.  She was then placed on anastrozole once she completed radiation.  I discussed that she should stay on this for a total of 5 years, and will be due to complete therapy in September.  2.  Tremors, this has not been diagnosed as Parkinson's.    3.  Osteopenia of the hip.  She  will be due for repeat bone density in October 2022.  Plan: She knows to continue her anastrozole until September.  We will plan to see her back in November for routine follow up with bilateral mammogram and bone density scan.  She understands and agrees with this plan of care.   I provided 15 minutes of face-to-face time during this this encounter and > 50% was spent counseling as documented under my assessment and plan.    Derwood Kaplan, MD [facility] AT [facility] [address] [place] [address] Dept: [phone] Dept Fax: [phone]   I, Rita Ohara, am acting as scribe for Derwood Kaplan, MD  I have reviewed this report as typed by the medical scribe, and it is complete and accurate.

## 2020-12-07 DIAGNOSIS — G4710 Hypersomnia, unspecified: Secondary | ICD-10-CM | POA: Diagnosis not present

## 2020-12-07 DIAGNOSIS — G4733 Obstructive sleep apnea (adult) (pediatric): Secondary | ICD-10-CM | POA: Diagnosis not present

## 2020-12-08 ENCOUNTER — Other Ambulatory Visit: Payer: Self-pay

## 2020-12-08 ENCOUNTER — Other Ambulatory Visit: Payer: Self-pay | Admitting: Oncology

## 2020-12-08 ENCOUNTER — Telehealth: Payer: Self-pay | Admitting: Oncology

## 2020-12-08 ENCOUNTER — Encounter: Payer: Self-pay | Admitting: Oncology

## 2020-12-08 ENCOUNTER — Inpatient Hospital Stay: Payer: Medicare Other | Attending: Oncology | Admitting: Oncology

## 2020-12-08 DIAGNOSIS — Z170 Estrogen receptor positive status [ER+]: Secondary | ICD-10-CM | POA: Diagnosis not present

## 2020-12-08 DIAGNOSIS — C50411 Malignant neoplasm of upper-outer quadrant of right female breast: Secondary | ICD-10-CM

## 2020-12-08 DIAGNOSIS — Z780 Asymptomatic menopausal state: Secondary | ICD-10-CM

## 2020-12-08 DIAGNOSIS — M8580 Other specified disorders of bone density and structure, unspecified site: Secondary | ICD-10-CM

## 2020-12-08 NOTE — Telephone Encounter (Signed)
Per 2/10 LOS, patient scheduled for Mammo on 11/28 and Follow Up on 12/1.  Gave patient orders/Appt Summary

## 2021-01-04 DIAGNOSIS — G4733 Obstructive sleep apnea (adult) (pediatric): Secondary | ICD-10-CM | POA: Diagnosis not present

## 2021-01-25 DIAGNOSIS — E118 Type 2 diabetes mellitus with unspecified complications: Secondary | ICD-10-CM | POA: Diagnosis not present

## 2021-01-25 DIAGNOSIS — I10 Essential (primary) hypertension: Secondary | ICD-10-CM | POA: Diagnosis not present

## 2021-02-04 DIAGNOSIS — G4733 Obstructive sleep apnea (adult) (pediatric): Secondary | ICD-10-CM | POA: Diagnosis not present

## 2021-02-16 DIAGNOSIS — G4733 Obstructive sleep apnea (adult) (pediatric): Secondary | ICD-10-CM | POA: Diagnosis not present

## 2021-02-16 DIAGNOSIS — R5383 Other fatigue: Secondary | ICD-10-CM | POA: Diagnosis not present

## 2021-02-16 DIAGNOSIS — E559 Vitamin D deficiency, unspecified: Secondary | ICD-10-CM | POA: Diagnosis not present

## 2021-02-16 DIAGNOSIS — R0600 Dyspnea, unspecified: Secondary | ICD-10-CM | POA: Diagnosis not present

## 2021-02-25 DIAGNOSIS — I10 Essential (primary) hypertension: Secondary | ICD-10-CM | POA: Diagnosis not present

## 2021-02-25 DIAGNOSIS — E118 Type 2 diabetes mellitus with unspecified complications: Secondary | ICD-10-CM | POA: Diagnosis not present

## 2021-03-06 DIAGNOSIS — G4733 Obstructive sleep apnea (adult) (pediatric): Secondary | ICD-10-CM | POA: Diagnosis not present

## 2021-03-07 DIAGNOSIS — G4710 Hypersomnia, unspecified: Secondary | ICD-10-CM | POA: Diagnosis not present

## 2021-03-07 DIAGNOSIS — G4733 Obstructive sleep apnea (adult) (pediatric): Secondary | ICD-10-CM | POA: Diagnosis not present

## 2021-03-15 DIAGNOSIS — E118 Type 2 diabetes mellitus with unspecified complications: Secondary | ICD-10-CM | POA: Diagnosis not present

## 2021-03-15 DIAGNOSIS — R7989 Other specified abnormal findings of blood chemistry: Secondary | ICD-10-CM | POA: Diagnosis not present

## 2021-03-15 DIAGNOSIS — Z0000 Encounter for general adult medical examination without abnormal findings: Secondary | ICD-10-CM | POA: Diagnosis not present

## 2021-03-15 DIAGNOSIS — D509 Iron deficiency anemia, unspecified: Secondary | ICD-10-CM | POA: Diagnosis not present

## 2021-03-17 DIAGNOSIS — G4733 Obstructive sleep apnea (adult) (pediatric): Secondary | ICD-10-CM | POA: Diagnosis not present

## 2021-04-06 DIAGNOSIS — G4733 Obstructive sleep apnea (adult) (pediatric): Secondary | ICD-10-CM | POA: Diagnosis not present

## 2021-04-24 ENCOUNTER — Other Ambulatory Visit: Payer: Self-pay | Admitting: Hematology and Oncology

## 2021-04-24 MED ORDER — ANASTROZOLE 1 MG PO TABS: 1.0000 mg | ORAL_TABLET | Freq: Every day | ORAL | 3 refills | Status: DC

## 2021-04-27 DIAGNOSIS — I10 Essential (primary) hypertension: Secondary | ICD-10-CM | POA: Diagnosis not present

## 2021-04-27 DIAGNOSIS — E118 Type 2 diabetes mellitus with unspecified complications: Secondary | ICD-10-CM | POA: Diagnosis not present

## 2021-05-06 DIAGNOSIS — G4733 Obstructive sleep apnea (adult) (pediatric): Secondary | ICD-10-CM | POA: Diagnosis not present

## 2021-05-10 DIAGNOSIS — H401131 Primary open-angle glaucoma, bilateral, mild stage: Secondary | ICD-10-CM | POA: Diagnosis not present

## 2021-05-22 IMAGING — NM NUCLEAR MEDICINE DATSCAN
4 series · 35 of 40 positions shown · non-contrast
Comparison: None.

CLINICAL DATA: 68-year-old female with progressive and worsening
gait. No fall. RIGHT arm tremor for several years.

EXAM:
NUCLEAR MEDICINE BRAIN IMAGING WITH SPECT  (DaTscan )
TECHNIQUE: SPECT images of the brain were obtained after intravenous injection
of radiopharmaceutical. 4 hour post injection imaging. Appropriate
positioning. 0.8 ml lugols solution administered in a.m
RADIOPHARMACEUTICALS:  4.73 millicuries I 123 Ioflupane

[Series 1: spect - (id) _(id)_cor · 4.1mm · 4.14mm/px · 5 of 128 frames shown]
[frame 11/128]
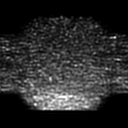
[frame 32/128]
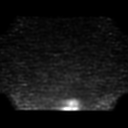
[frame 54/128]
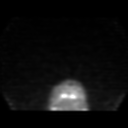
[frame 96/128]
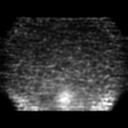
[frame 118/128]
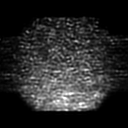

[Series 1: brain spect · 4.14mm/px · 5 of 120 frames shown]
[frame 11/120  full-range]
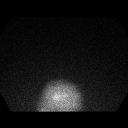
[frame 31/120  full-range]
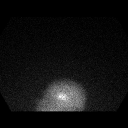
[frame 51/120  full-range]
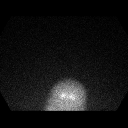
[frame 71/120  full-range]
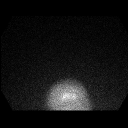
[frame 91/120  full-range]
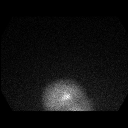

[Series 1: spect - (id) _(id) · 4.1mm · 4.14mm/px · 6 of 128 frames shown]
[frame 11/128]
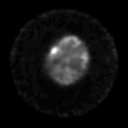
[frame 32/128]
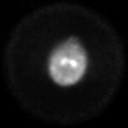
[frame 54/128]
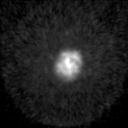
[frame 75/128]
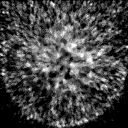
[frame 96/128]
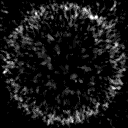
[frame 118/128]
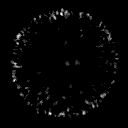

[Series 1016: mpr (id) range · 0.59mm/px · 19 of 32 slices shown]
[im 1/32]
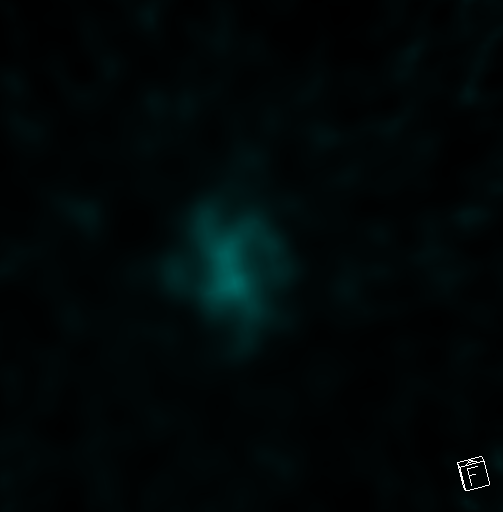
[im 3/32]
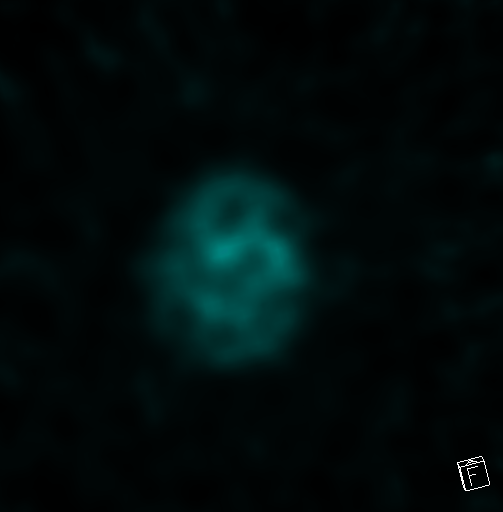
[im 5/32]
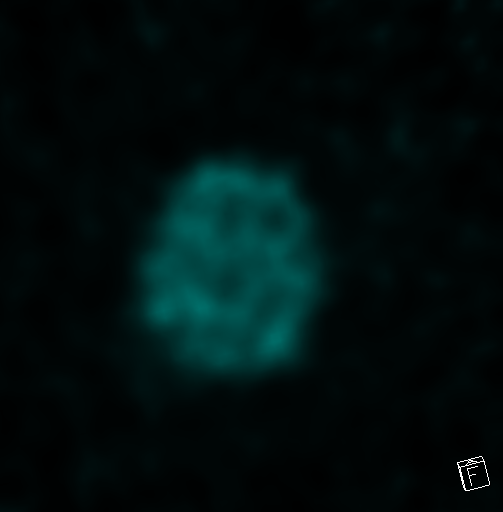
[im 6/32]
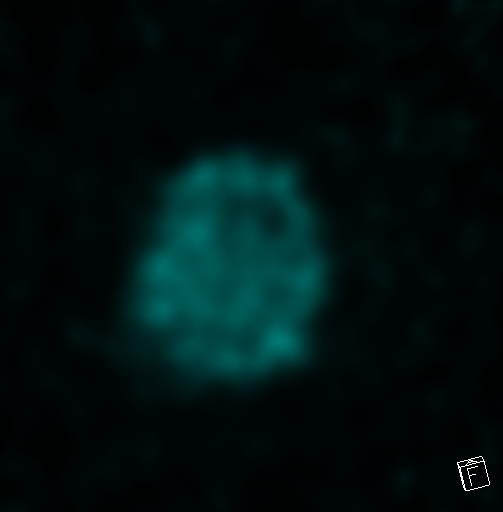
[im 8/32]
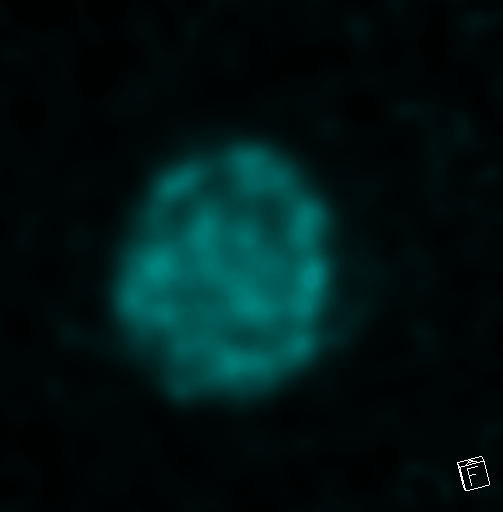
[im 9/32]
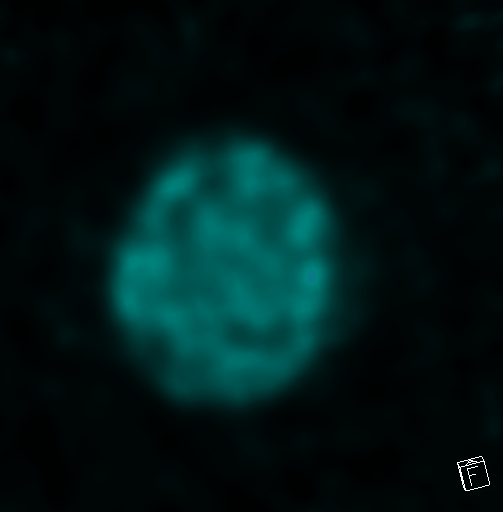
[im 11/32]
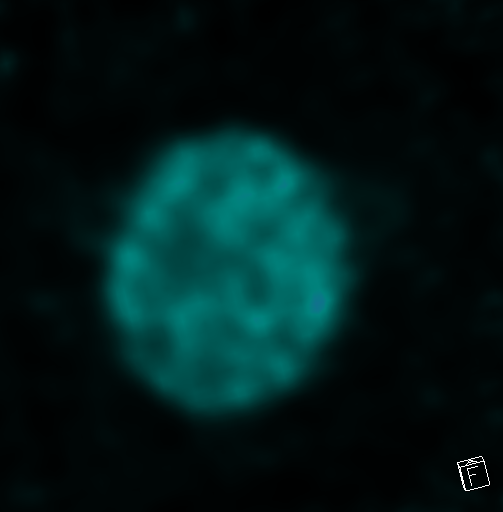
[im 12/32]
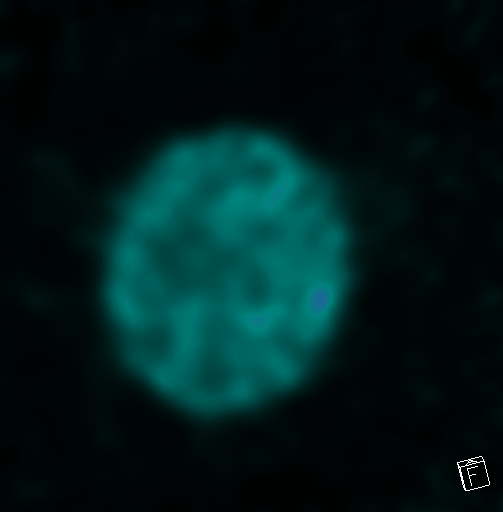
[im 15/32]
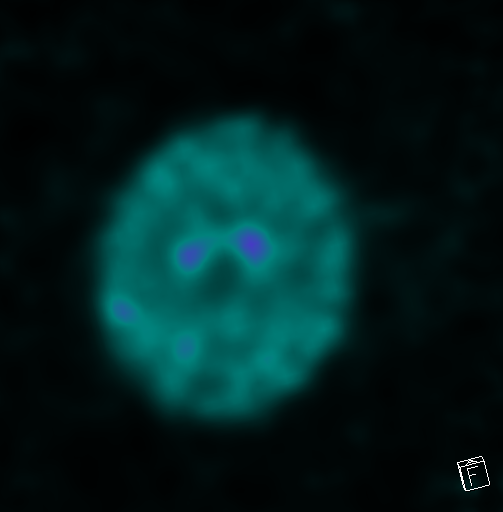
[im 17/32]
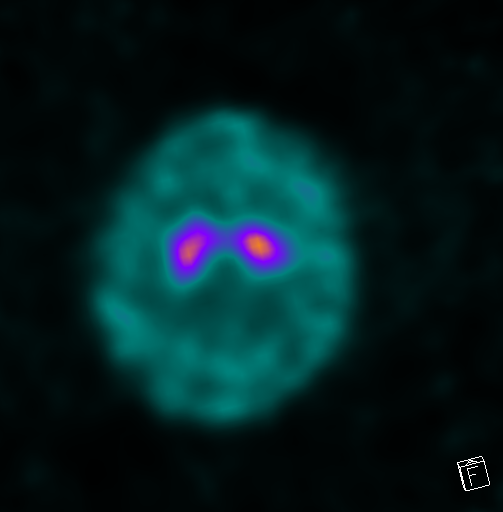
[im 18/32]
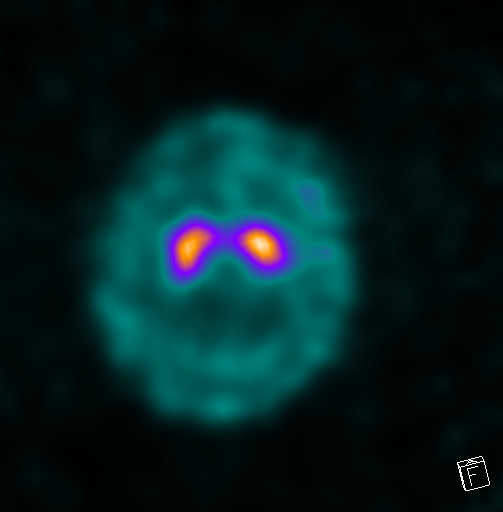
[im 20/32]
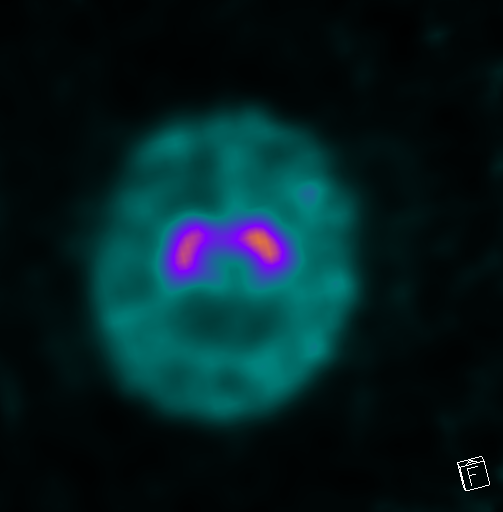
[im 21/32]
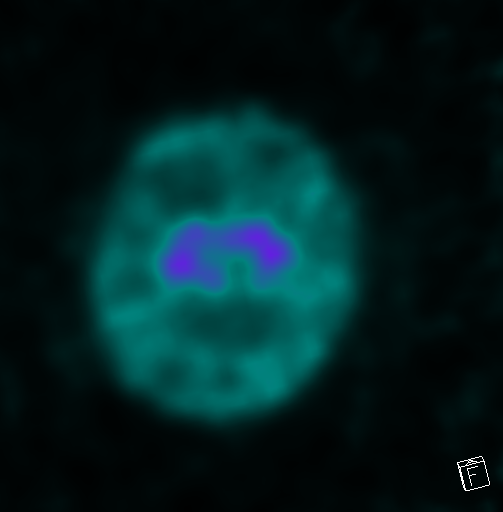
[im 23/32]
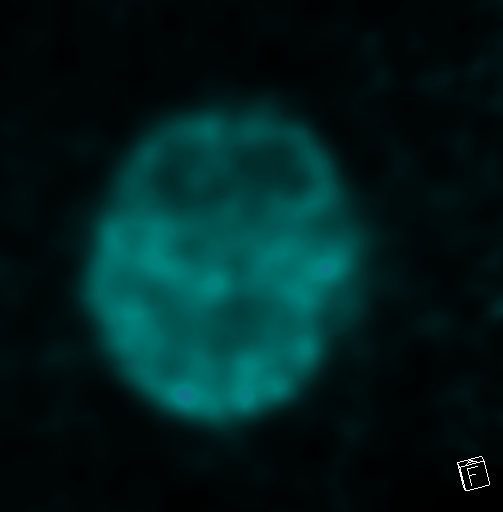
[im 24/32]
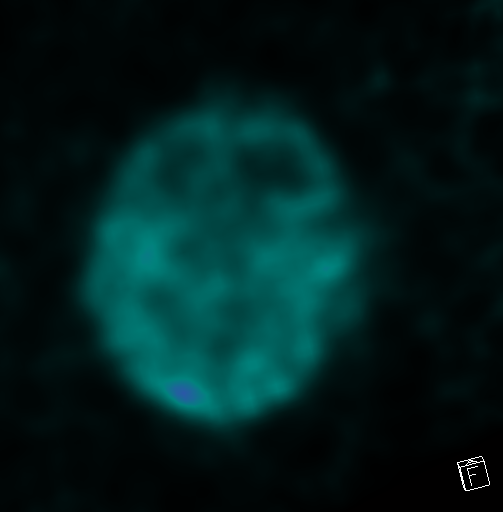
[im 27/32]
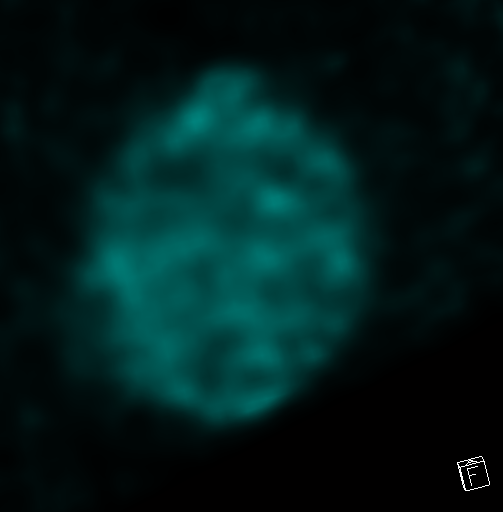
[im 29/32]
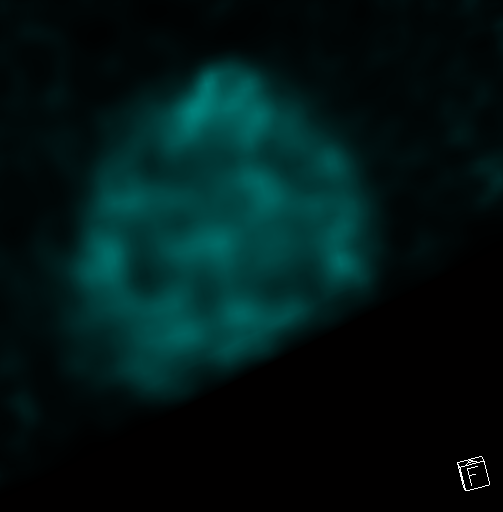
[im 30/32]
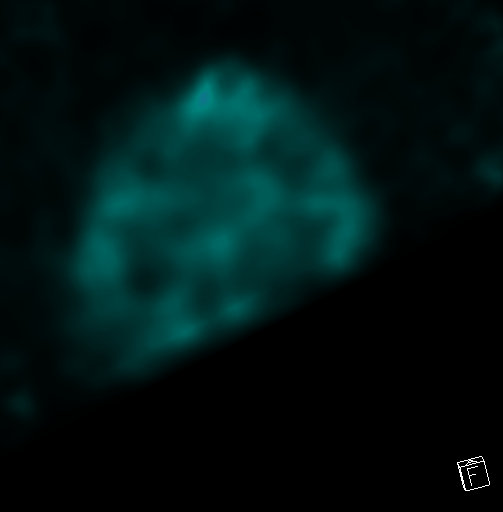
[im 32/32]
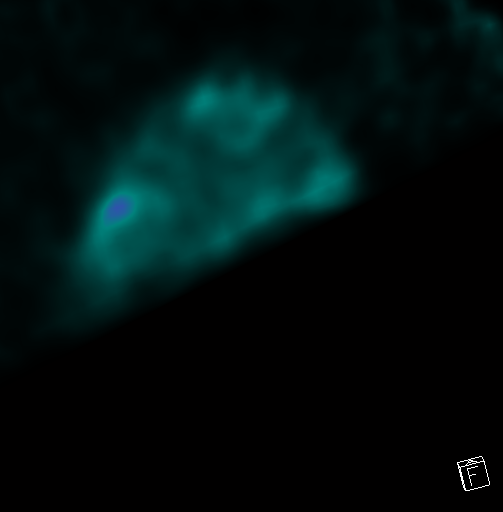

[35 of 40 positions shown; findings below may reference images not displayed]

FINDINGS: There is symmetric intense uptake within the heads of the caudate
nuclei and putamen. The LEFT putamen is slightly short compared
compared to the RIGHT but felt within normal limits. The LEFT and
RIGHT striata are normal "comma" shaped with fairly intense activity
as above.
IMPRESSION: Uniform symmetric uptake within the basal ganglia is favored within
normal limits. Findings are not typical for Parkinson's syndrome
pathology.

## 2021-06-06 DIAGNOSIS — G4710 Hypersomnia, unspecified: Secondary | ICD-10-CM | POA: Diagnosis not present

## 2021-06-06 DIAGNOSIS — G4733 Obstructive sleep apnea (adult) (pediatric): Secondary | ICD-10-CM | POA: Diagnosis not present

## 2021-07-06 DIAGNOSIS — E559 Vitamin D deficiency, unspecified: Secondary | ICD-10-CM | POA: Diagnosis not present

## 2021-07-06 DIAGNOSIS — D638 Anemia in other chronic diseases classified elsewhere: Secondary | ICD-10-CM | POA: Diagnosis not present

## 2021-07-06 DIAGNOSIS — G4733 Obstructive sleep apnea (adult) (pediatric): Secondary | ICD-10-CM | POA: Diagnosis not present

## 2021-07-06 DIAGNOSIS — R0600 Dyspnea, unspecified: Secondary | ICD-10-CM | POA: Diagnosis not present

## 2021-07-07 DIAGNOSIS — G4733 Obstructive sleep apnea (adult) (pediatric): Secondary | ICD-10-CM | POA: Diagnosis not present

## 2021-07-24 DIAGNOSIS — D500 Iron deficiency anemia secondary to blood loss (chronic): Secondary | ICD-10-CM | POA: Diagnosis not present

## 2021-07-24 DIAGNOSIS — R7989 Other specified abnormal findings of blood chemistry: Secondary | ICD-10-CM | POA: Diagnosis not present

## 2021-07-24 DIAGNOSIS — Z1211 Encounter for screening for malignant neoplasm of colon: Secondary | ICD-10-CM | POA: Diagnosis not present

## 2021-07-28 DIAGNOSIS — I10 Essential (primary) hypertension: Secondary | ICD-10-CM | POA: Diagnosis not present

## 2021-07-28 DIAGNOSIS — E118 Type 2 diabetes mellitus with unspecified complications: Secondary | ICD-10-CM | POA: Diagnosis not present

## 2021-08-02 DIAGNOSIS — E118 Type 2 diabetes mellitus with unspecified complications: Secondary | ICD-10-CM | POA: Diagnosis not present

## 2021-08-02 DIAGNOSIS — G20 Parkinson's disease: Secondary | ICD-10-CM | POA: Diagnosis not present

## 2021-08-02 DIAGNOSIS — M21372 Foot drop, left foot: Secondary | ICD-10-CM | POA: Diagnosis not present

## 2021-08-02 DIAGNOSIS — Z23 Encounter for immunization: Secondary | ICD-10-CM | POA: Diagnosis not present

## 2021-08-03 DIAGNOSIS — D126 Benign neoplasm of colon, unspecified: Secondary | ICD-10-CM | POA: Insufficient documentation

## 2021-08-14 DIAGNOSIS — M21372 Foot drop, left foot: Secondary | ICD-10-CM | POA: Diagnosis not present

## 2021-08-14 DIAGNOSIS — M25672 Stiffness of left ankle, not elsewhere classified: Secondary | ICD-10-CM | POA: Diagnosis not present

## 2021-08-14 DIAGNOSIS — M25671 Stiffness of right ankle, not elsewhere classified: Secondary | ICD-10-CM | POA: Diagnosis not present

## 2021-08-14 DIAGNOSIS — R2681 Unsteadiness on feet: Secondary | ICD-10-CM | POA: Diagnosis not present

## 2021-08-14 DIAGNOSIS — M6281 Muscle weakness (generalized): Secondary | ICD-10-CM | POA: Diagnosis not present

## 2021-08-14 DIAGNOSIS — R5383 Other fatigue: Secondary | ICD-10-CM | POA: Diagnosis not present

## 2021-08-15 DIAGNOSIS — K449 Diaphragmatic hernia without obstruction or gangrene: Secondary | ICD-10-CM | POA: Diagnosis not present

## 2021-08-15 DIAGNOSIS — K635 Polyp of colon: Secondary | ICD-10-CM | POA: Diagnosis not present

## 2021-08-15 DIAGNOSIS — K921 Melena: Secondary | ICD-10-CM | POA: Diagnosis not present

## 2021-08-15 DIAGNOSIS — D125 Benign neoplasm of sigmoid colon: Secondary | ICD-10-CM | POA: Diagnosis not present

## 2021-08-15 DIAGNOSIS — D509 Iron deficiency anemia, unspecified: Secondary | ICD-10-CM | POA: Diagnosis not present

## 2021-08-15 DIAGNOSIS — D126 Benign neoplasm of colon, unspecified: Secondary | ICD-10-CM | POA: Diagnosis not present

## 2021-08-15 DIAGNOSIS — K5730 Diverticulosis of large intestine without perforation or abscess without bleeding: Secondary | ICD-10-CM | POA: Diagnosis not present

## 2021-08-15 DIAGNOSIS — I10 Essential (primary) hypertension: Secondary | ICD-10-CM | POA: Diagnosis not present

## 2021-08-18 DIAGNOSIS — M85852 Other specified disorders of bone density and structure, left thigh: Secondary | ICD-10-CM | POA: Diagnosis not present

## 2021-08-18 DIAGNOSIS — Z5321 Procedure and treatment not carried out due to patient leaving prior to being seen by health care provider: Secondary | ICD-10-CM | POA: Diagnosis not present

## 2021-08-18 DIAGNOSIS — M8589 Other specified disorders of bone density and structure, multiple sites: Secondary | ICD-10-CM | POA: Diagnosis not present

## 2021-08-21 DIAGNOSIS — R2681 Unsteadiness on feet: Secondary | ICD-10-CM | POA: Diagnosis not present

## 2021-08-21 DIAGNOSIS — R5383 Other fatigue: Secondary | ICD-10-CM | POA: Diagnosis not present

## 2021-08-21 DIAGNOSIS — M21372 Foot drop, left foot: Secondary | ICD-10-CM | POA: Diagnosis not present

## 2021-08-21 DIAGNOSIS — M25672 Stiffness of left ankle, not elsewhere classified: Secondary | ICD-10-CM | POA: Diagnosis not present

## 2021-08-21 DIAGNOSIS — M25671 Stiffness of right ankle, not elsewhere classified: Secondary | ICD-10-CM | POA: Diagnosis not present

## 2021-08-21 DIAGNOSIS — M6281 Muscle weakness (generalized): Secondary | ICD-10-CM | POA: Diagnosis not present

## 2021-08-24 DIAGNOSIS — M25671 Stiffness of right ankle, not elsewhere classified: Secondary | ICD-10-CM | POA: Diagnosis not present

## 2021-08-24 DIAGNOSIS — R2681 Unsteadiness on feet: Secondary | ICD-10-CM | POA: Diagnosis not present

## 2021-08-24 DIAGNOSIS — R5383 Other fatigue: Secondary | ICD-10-CM | POA: Diagnosis not present

## 2021-08-24 DIAGNOSIS — M25672 Stiffness of left ankle, not elsewhere classified: Secondary | ICD-10-CM | POA: Diagnosis not present

## 2021-08-24 DIAGNOSIS — M21372 Foot drop, left foot: Secondary | ICD-10-CM | POA: Diagnosis not present

## 2021-08-24 DIAGNOSIS — M6281 Muscle weakness (generalized): Secondary | ICD-10-CM | POA: Diagnosis not present

## 2021-08-29 DIAGNOSIS — R5383 Other fatigue: Secondary | ICD-10-CM | POA: Diagnosis not present

## 2021-08-29 DIAGNOSIS — R2681 Unsteadiness on feet: Secondary | ICD-10-CM | POA: Diagnosis not present

## 2021-08-29 DIAGNOSIS — R293 Abnormal posture: Secondary | ICD-10-CM | POA: Diagnosis not present

## 2021-08-29 DIAGNOSIS — M25672 Stiffness of left ankle, not elsewhere classified: Secondary | ICD-10-CM | POA: Diagnosis not present

## 2021-08-29 DIAGNOSIS — E559 Vitamin D deficiency, unspecified: Secondary | ICD-10-CM | POA: Diagnosis not present

## 2021-08-29 DIAGNOSIS — M25671 Stiffness of right ankle, not elsewhere classified: Secondary | ICD-10-CM | POA: Diagnosis not present

## 2021-08-29 DIAGNOSIS — M21372 Foot drop, left foot: Secondary | ICD-10-CM | POA: Diagnosis not present

## 2021-08-29 DIAGNOSIS — M6281 Muscle weakness (generalized): Secondary | ICD-10-CM | POA: Diagnosis not present

## 2021-09-01 DIAGNOSIS — M25671 Stiffness of right ankle, not elsewhere classified: Secondary | ICD-10-CM | POA: Diagnosis not present

## 2021-09-01 DIAGNOSIS — R293 Abnormal posture: Secondary | ICD-10-CM | POA: Diagnosis not present

## 2021-09-01 DIAGNOSIS — Z23 Encounter for immunization: Secondary | ICD-10-CM | POA: Diagnosis not present

## 2021-09-01 DIAGNOSIS — M25672 Stiffness of left ankle, not elsewhere classified: Secondary | ICD-10-CM | POA: Diagnosis not present

## 2021-09-01 DIAGNOSIS — M21372 Foot drop, left foot: Secondary | ICD-10-CM | POA: Diagnosis not present

## 2021-09-01 DIAGNOSIS — R5383 Other fatigue: Secondary | ICD-10-CM | POA: Diagnosis not present

## 2021-09-01 DIAGNOSIS — R2681 Unsteadiness on feet: Secondary | ICD-10-CM | POA: Diagnosis not present

## 2021-09-01 DIAGNOSIS — M6281 Muscle weakness (generalized): Secondary | ICD-10-CM | POA: Diagnosis not present

## 2021-09-06 DIAGNOSIS — G4710 Hypersomnia, unspecified: Secondary | ICD-10-CM | POA: Diagnosis not present

## 2021-09-06 DIAGNOSIS — G4733 Obstructive sleep apnea (adult) (pediatric): Secondary | ICD-10-CM | POA: Diagnosis not present

## 2021-09-07 DIAGNOSIS — R0600 Dyspnea, unspecified: Secondary | ICD-10-CM | POA: Diagnosis not present

## 2021-09-07 DIAGNOSIS — G4733 Obstructive sleep apnea (adult) (pediatric): Secondary | ICD-10-CM | POA: Diagnosis not present

## 2021-09-07 DIAGNOSIS — D638 Anemia in other chronic diseases classified elsewhere: Secondary | ICD-10-CM | POA: Diagnosis not present

## 2021-09-07 DIAGNOSIS — E559 Vitamin D deficiency, unspecified: Secondary | ICD-10-CM | POA: Diagnosis not present

## 2021-09-08 ENCOUNTER — Encounter: Payer: Self-pay | Admitting: Oncology

## 2021-09-08 DIAGNOSIS — M6281 Muscle weakness (generalized): Secondary | ICD-10-CM | POA: Diagnosis not present

## 2021-09-08 DIAGNOSIS — M21372 Foot drop, left foot: Secondary | ICD-10-CM | POA: Diagnosis not present

## 2021-09-08 DIAGNOSIS — R293 Abnormal posture: Secondary | ICD-10-CM | POA: Diagnosis not present

## 2021-09-08 DIAGNOSIS — R5383 Other fatigue: Secondary | ICD-10-CM | POA: Diagnosis not present

## 2021-09-08 DIAGNOSIS — R2681 Unsteadiness on feet: Secondary | ICD-10-CM | POA: Diagnosis not present

## 2021-09-08 DIAGNOSIS — M25672 Stiffness of left ankle, not elsewhere classified: Secondary | ICD-10-CM | POA: Diagnosis not present

## 2021-09-08 DIAGNOSIS — M25671 Stiffness of right ankle, not elsewhere classified: Secondary | ICD-10-CM | POA: Diagnosis not present

## 2021-09-11 DIAGNOSIS — M21372 Foot drop, left foot: Secondary | ICD-10-CM | POA: Diagnosis not present

## 2021-09-11 DIAGNOSIS — M25672 Stiffness of left ankle, not elsewhere classified: Secondary | ICD-10-CM | POA: Diagnosis not present

## 2021-09-11 DIAGNOSIS — M6281 Muscle weakness (generalized): Secondary | ICD-10-CM | POA: Diagnosis not present

## 2021-09-11 DIAGNOSIS — R2681 Unsteadiness on feet: Secondary | ICD-10-CM | POA: Diagnosis not present

## 2021-09-11 DIAGNOSIS — M25671 Stiffness of right ankle, not elsewhere classified: Secondary | ICD-10-CM | POA: Diagnosis not present

## 2021-09-11 DIAGNOSIS — R293 Abnormal posture: Secondary | ICD-10-CM | POA: Diagnosis not present

## 2021-09-11 DIAGNOSIS — R5383 Other fatigue: Secondary | ICD-10-CM | POA: Diagnosis not present

## 2021-09-13 DIAGNOSIS — R293 Abnormal posture: Secondary | ICD-10-CM | POA: Diagnosis not present

## 2021-09-13 DIAGNOSIS — R5383 Other fatigue: Secondary | ICD-10-CM | POA: Diagnosis not present

## 2021-09-13 DIAGNOSIS — R2681 Unsteadiness on feet: Secondary | ICD-10-CM | POA: Diagnosis not present

## 2021-09-13 DIAGNOSIS — M21372 Foot drop, left foot: Secondary | ICD-10-CM | POA: Diagnosis not present

## 2021-09-13 DIAGNOSIS — M25671 Stiffness of right ankle, not elsewhere classified: Secondary | ICD-10-CM | POA: Diagnosis not present

## 2021-09-13 DIAGNOSIS — M25672 Stiffness of left ankle, not elsewhere classified: Secondary | ICD-10-CM | POA: Diagnosis not present

## 2021-09-13 DIAGNOSIS — M6281 Muscle weakness (generalized): Secondary | ICD-10-CM | POA: Diagnosis not present

## 2021-09-18 DIAGNOSIS — M21372 Foot drop, left foot: Secondary | ICD-10-CM | POA: Diagnosis not present

## 2021-09-18 DIAGNOSIS — R5383 Other fatigue: Secondary | ICD-10-CM | POA: Diagnosis not present

## 2021-09-18 DIAGNOSIS — R293 Abnormal posture: Secondary | ICD-10-CM | POA: Diagnosis not present

## 2021-09-18 DIAGNOSIS — M25672 Stiffness of left ankle, not elsewhere classified: Secondary | ICD-10-CM | POA: Diagnosis not present

## 2021-09-18 DIAGNOSIS — R2681 Unsteadiness on feet: Secondary | ICD-10-CM | POA: Diagnosis not present

## 2021-09-18 DIAGNOSIS — M6281 Muscle weakness (generalized): Secondary | ICD-10-CM | POA: Diagnosis not present

## 2021-09-18 DIAGNOSIS — M25671 Stiffness of right ankle, not elsewhere classified: Secondary | ICD-10-CM | POA: Diagnosis not present

## 2021-09-19 NOTE — Progress Notes (Signed)
[facility]  [address] [place],  [place]  [address] [phone]  Clinic Day:  09/28/2021  Referring physician: Ronita Hipps, MD  This document serves as a record of services personally performed by Hosie Poisson, MD. It was created on their behalf by Curry,Lauren E, a trained medical scribe. The creation of this record is based on the scribe's personal observations and the provider's statements to them.  CHIEF COMPLAINT:  CC: History of stage IIA invasive ductal carcinoma of the right breast  Current Treatment:  Anastrozole 1 mg daily for a total of 10 years   HISTORY OF PRESENT ILLNESS:  Stephanie Anderson is a 70 y.o. female with a history of stage IIA (T2N0M0) invasive ductal carcinoma in the upper outer quadrant of the right breast in July 2017.  This was treated with lumpectomy then adjuvant radiation, and once completed, she was placed on anastrozole in September. She has not undergone genetic testing, but has no family history.  She transferred her care after moving from [place] last October 2020.  She underwent menarche around age 63, and went through menopause in her 52's.  She was never placed on hormones.  She has never had children. Bone density from October 2020 revealed osteopenia of the hip.    INTERVAL HISTORY:  Stephanie Anderson is here for routine follow up and continues to have shakes and tremors consistent with Parkinson's disease, and uses a cane to ambulate. She completed 5 years of anastrozole in September 2022, but asks about continuing for a total of 10 years. I am in agreement as she was a stage II and her bone density remains stable. Annual bilateral mammogram from November was clear. Bone density from October revealed osteopenia with a T-score of  -1.7 of the left femur, previously -1.6. Dual femur total mean is considered normal at -0.6, previously -0.5. Left forearm measures -1.7, previously -0.4. Her  appetite is good, and she  has gained 3 and 1/2 pounds since her last visit.  She denies fever, chills or other signs of infection.  She denies nausea, vomiting, bowel issues, or abdominal pain.  She denies sore throat, cough, dyspnea, or chest pain. She is up to date on colonoscopy and EGD with Dr. Lyda Jester as of October 2022. Colonoscopy revealed two polyps and pathology did reveal one to be a tubular adenoma of the sigmoid colon  REVIEW OF SYSTEMS:  Review of Systems  Constitutional: Negative.  Negative for appetite change, chills, fatigue, fever and unexpected weight change.  HENT:  Negative.    Eyes: Negative.   Respiratory: Negative.  Negative for chest tightness, cough, hemoptysis, shortness of breath and wheezing.   Cardiovascular: Negative.  Negative for chest pain, leg swelling and palpitations.  Gastrointestinal: Negative.  Negative for abdominal distention, abdominal pain, blood in stool, constipation, diarrhea, nausea and vomiting.  Endocrine: Negative.   Genitourinary: Negative.  Negative for difficulty urinating, dysuria, frequency and hematuria.   Musculoskeletal:  Positive for gait problem (uses a cane to ambulate). Negative for arthralgias, back pain, flank pain and myalgias.  Skin: Negative.   Neurological:  Positive for gait problem (uses a cane to ambulate). Negative for dizziness, extremity weakness, headaches, light-headedness, numbness, seizures and speech difficulty.       Tremors, stable  Hematological: Negative.   Psychiatric/Behavioral: Negative.  Negative for depression and sleep disturbance. The patient is not nervous/anxious.     VITALS:  Blood pressure (!) 175/80, pulse 80, temperature 98.7 F (37.1 C), temperature  source Oral, resp. rate 20, height 5\' 6"  (1.676 m), weight 249 lb 12.8 oz (113.3 kg), SpO2 95 %.  Wt Readings from Last 3 Encounters:  09/28/21 249 lb 12.8 oz (113.3 kg)  12/08/20 246 lb 3.2 oz (111.7 kg)  05/08/19 240 lb (108.9 kg)    Body mass index is 40.32  kg/m.  Performance status (ECOG): 1 - Symptomatic but completely ambulatory  PHYSICAL EXAM:  Physical Exam Constitutional:      General: She is not in acute distress.    Appearance: Normal appearance. She is normal weight.  HENT:     Head: Normocephalic and atraumatic.  Eyes:     General: No scleral icterus.    Extraocular Movements: Extraocular movements intact.     Conjunctiva/sclera: Conjunctivae normal.     Pupils: Pupils are equal, round, and reactive to light.  Cardiovascular:     Rate and Rhythm: Normal rate and regular rhythm.     Pulses: Normal pulses.     Heart sounds: Normal heart sounds. No murmur heard.   No friction rub. No gallop.  Pulmonary:     Effort: Pulmonary effort is normal. No respiratory distress.     Breath sounds: Normal breath sounds.  Chest:     Comments: Deep well healed scar in the upper outer quadrant of the right breast. Fibrocystic changes in the bottom half of the left breast. Both breasts are without masses. Abdominal:     General: Bowel sounds are normal. There is no distension.     Palpations: Abdomen is soft. There is no hepatomegaly, splenomegaly or mass.     Tenderness: There is no abdominal tenderness.  Musculoskeletal:        General: Normal range of motion.     Cervical back: Normal range of motion and neck supple.     Right lower leg: No edema.     Left lower leg: No edema.  Lymphadenopathy:     Cervical: No cervical adenopathy.  Skin:    General: Skin is warm and dry.  Neurological:     General: No focal deficit present.     Mental Status: She is alert and oriented to person, place, and time. Mental status is at baseline.     Motor: Tremor (constant and bradykinesia) present.  Psychiatric:        Mood and Affect: Mood normal.        Behavior: Behavior normal.        Thought Content: Thought content normal.        Judgment: Judgment normal.    LABS:  No flowsheet data found. No flowsheet data found.   STUDIES:    EXAM: 08/18/2021 DUAL X-RAY ABSORPTIOMETRY (DXA) FOR BONE MINERAL DENSITY  IMPRESSION: Stephanie Anderson completed a BMD test on 08/18/2021 using the [employer] (analysis version: 13.60) manufactured by [employer]. The following summarizes the results of our evaluation. TECHNOLOGIST: Loman Chroman PATIENT BIOGRAPHICAL: Name: Stephanie Anderson, Stephanie Anderson Patient ID: [id number] Birth Date: [birth year] Height: 66.0 in. Gender: Female Exam Date: 08/18/2021 Weight: 248.0 lbs. Indications:M85.89 Secondary Osteoporosis Fractures: Treatments:  ASSESSMENT: The BMD measured at Femur Neck Left is 0.795 g/cm2 with a T-score of -1.7. This patient is considered osteopenic according to [facility]) criteria. The scan quality is good. Lumbar spine was not utilized due to advanced degenerative changes. Site Region Measured Measured WHO Young Adult BMD Date Age Classification T-score DualFemur Neck Left 08/18/2021 70.6 Osteopenia -1.7 0.795 g/cm2 DualFemur Neck Left 08/17/2019 68.6 Osteopenia -1.6 0.813 g/cm2  DualFemur Total Mean 08/18/2021 70.6 Normal -0.6 0.932 g/cm2 DualFemur Total Mean 08/17/2019 68.6 Normal -0.5 0.949 g/cm2  Left Forearm Radius 33% 08/18/2021 70.6 Osteopenia -1.7 0.731 g/cm2 Left Forearm Radius 33% 08/17/2019 68.6 Normal -0.4 0.841 g/cm2  EXAM: 09/25/2021 DIGITAL DIAGNOSTIC BILATERAL MAMMOGRAM WITH TOMOSYNTHESIS AND CAD  TECHNIQUE: Bilateral digital diagnostic mammography and breast tomosynthesis was performed. The images were evaluated with computer-aided detection.  COMPARISON: Previous exam(s).  ACR Breast Density Category b: There are scattered areas of fibroglandular density.  FINDINGS: Stable post lumpectomy changes over the upper outer right breast. Remainder of the right breast as well as the left breast is unchanged.  IMPRESSION: Stable post lumpectomy changes of the right breast.  Allergies:  Allergies  Allergen Reactions   Furosemide  Other (See Comments)    Abdominal pain Abdominal pain    Current Medications: Current Outpatient Medications  Medication Sig Dispense Refill   albuterol (VENTOLIN HFA) 108 (90 Base) MCG/ACT inhaler Inhale into the lungs every 4 (four) hours as needed for wheezing or shortness of breath.     anastrozole (ARIMIDEX) 1 MG tablet Take 1 tablet (1 mg total) by mouth daily. 1 oral every morning. 90 tablet 3   atenolol (TENORMIN) 25 MG tablet Take by mouth daily. 1 tab every morning     budesonide (PULMICORT) 0.5 MG/2ML nebulizer solution SMARTSIG:1 Vial(s) Via Nebulizer Every 12 Hours     hydrochlorothiazide (HYDRODIURIL) 50 MG tablet Take 50 mg by mouth daily. 1 tablet every morning     losartan (COZAAR) 50 MG tablet Take 50 mg by mouth every morning.     potassium chloride (KLOR-CON) 10 MEQ tablet Take 10 mEq by mouth daily.     rosuvastatin (CRESTOR) 20 MG tablet Take 20 mg by mouth at bedtime.     simvastatin (ZOCOR) 20 MG tablet Take 20 mg by mouth daily. 1 oral every evening     No current facility-administered medications for this visit.     ASSESSMENT & PLAN:   Assessment:   1.  History of stage II (T2N0M0) invasive ductal carcinoma in 2017, treated with lumpectomy and radiation.  She was then placed on anastrozole, and completed 5 years in September 2022. She wishes to continue this for 10 years and as long as there is no worsening on her bone density, I am in agreement.  2.  Tremors, this has not been diagnosed as Parkinson's, but seems very suggestive of this.     3.  Osteopenia, fairly stable.  She will be due for repeat bone density in October 2024.  4.  Tubular adenoma of the sigmoid colon, October 2022.  Plan: At the patient's request, we will continue anastrozole for a total of 10 years. I will send in a 3 month supply today. We will plan to see her back in 1 year for routine follow up with bilateral mammogram.  She understands and agrees with this plan of care.   I  provided 20 minutes of face-to-face time during this this encounter and > 50% was spent counseling as documented under my assessment and plan.    Derwood Kaplan, MD [facility] AT [facility] [address] [place] [address] Dept: [phone] Dept Fax: [phone]   I, Rita Ohara, am acting as scribe for Derwood Kaplan, MD  I have reviewed this report as typed by the medical scribe, and it is complete and accurate.

## 2021-09-22 DIAGNOSIS — G252 Other specified forms of tremor: Secondary | ICD-10-CM | POA: Diagnosis not present

## 2021-09-25 DIAGNOSIS — C50411 Malignant neoplasm of upper-outer quadrant of right female breast: Secondary | ICD-10-CM | POA: Diagnosis not present

## 2021-09-25 DIAGNOSIS — R922 Inconclusive mammogram: Secondary | ICD-10-CM | POA: Diagnosis not present

## 2021-09-26 DIAGNOSIS — R293 Abnormal posture: Secondary | ICD-10-CM | POA: Diagnosis not present

## 2021-09-26 DIAGNOSIS — R2681 Unsteadiness on feet: Secondary | ICD-10-CM | POA: Diagnosis not present

## 2021-09-26 DIAGNOSIS — M25672 Stiffness of left ankle, not elsewhere classified: Secondary | ICD-10-CM | POA: Diagnosis not present

## 2021-09-26 DIAGNOSIS — M6281 Muscle weakness (generalized): Secondary | ICD-10-CM | POA: Diagnosis not present

## 2021-09-26 DIAGNOSIS — R5383 Other fatigue: Secondary | ICD-10-CM | POA: Diagnosis not present

## 2021-09-26 DIAGNOSIS — M25671 Stiffness of right ankle, not elsewhere classified: Secondary | ICD-10-CM | POA: Diagnosis not present

## 2021-09-26 DIAGNOSIS — M21372 Foot drop, left foot: Secondary | ICD-10-CM | POA: Diagnosis not present

## 2021-09-28 ENCOUNTER — Ambulatory Visit: Payer: Medicare Other | Admitting: Oncology

## 2021-09-28 ENCOUNTER — Encounter: Payer: Self-pay | Admitting: Oncology

## 2021-09-28 ENCOUNTER — Other Ambulatory Visit: Payer: Self-pay | Admitting: Oncology

## 2021-09-28 ENCOUNTER — Inpatient Hospital Stay: Payer: Medicare Other | Attending: Oncology | Admitting: Oncology

## 2021-09-28 VITALS — BP 175/80 | HR 80 | Temp 98.7°F | Resp 20 | Ht 66.0 in | Wt 249.8 lb

## 2021-09-28 DIAGNOSIS — R293 Abnormal posture: Secondary | ICD-10-CM | POA: Diagnosis not present

## 2021-09-28 DIAGNOSIS — Z780 Asymptomatic menopausal state: Secondary | ICD-10-CM | POA: Diagnosis not present

## 2021-09-28 DIAGNOSIS — R5383 Other fatigue: Secondary | ICD-10-CM | POA: Diagnosis not present

## 2021-09-28 DIAGNOSIS — M25672 Stiffness of left ankle, not elsewhere classified: Secondary | ICD-10-CM | POA: Diagnosis not present

## 2021-09-28 DIAGNOSIS — M8580 Other specified disorders of bone density and structure, unspecified site: Secondary | ICD-10-CM | POA: Diagnosis not present

## 2021-09-28 DIAGNOSIS — C50411 Malignant neoplasm of upper-outer quadrant of right female breast: Secondary | ICD-10-CM | POA: Diagnosis not present

## 2021-09-28 DIAGNOSIS — Z170 Estrogen receptor positive status [ER+]: Secondary | ICD-10-CM | POA: Diagnosis not present

## 2021-09-28 DIAGNOSIS — M6281 Muscle weakness (generalized): Secondary | ICD-10-CM | POA: Diagnosis not present

## 2021-09-28 DIAGNOSIS — D126 Benign neoplasm of colon, unspecified: Secondary | ICD-10-CM | POA: Diagnosis not present

## 2021-09-28 DIAGNOSIS — R2681 Unsteadiness on feet: Secondary | ICD-10-CM | POA: Diagnosis not present

## 2021-09-28 DIAGNOSIS — M25671 Stiffness of right ankle, not elsewhere classified: Secondary | ICD-10-CM | POA: Diagnosis not present

## 2021-09-28 DIAGNOSIS — M21372 Foot drop, left foot: Secondary | ICD-10-CM | POA: Diagnosis not present

## 2021-09-28 MED ORDER — ANASTROZOLE 1 MG PO TABS: 1.0000 mg | ORAL_TABLET | Freq: Every day | ORAL | 3 refills | Status: DC

## 2021-10-02 DIAGNOSIS — M25672 Stiffness of left ankle, not elsewhere classified: Secondary | ICD-10-CM | POA: Diagnosis not present

## 2021-10-02 DIAGNOSIS — R293 Abnormal posture: Secondary | ICD-10-CM | POA: Diagnosis not present

## 2021-10-02 DIAGNOSIS — R2681 Unsteadiness on feet: Secondary | ICD-10-CM | POA: Diagnosis not present

## 2021-10-02 DIAGNOSIS — R5383 Other fatigue: Secondary | ICD-10-CM | POA: Diagnosis not present

## 2021-10-02 DIAGNOSIS — M25671 Stiffness of right ankle, not elsewhere classified: Secondary | ICD-10-CM | POA: Diagnosis not present

## 2021-10-02 DIAGNOSIS — M21372 Foot drop, left foot: Secondary | ICD-10-CM | POA: Diagnosis not present

## 2021-10-02 DIAGNOSIS — M6281 Muscle weakness (generalized): Secondary | ICD-10-CM | POA: Diagnosis not present

## 2021-10-05 DIAGNOSIS — M21372 Foot drop, left foot: Secondary | ICD-10-CM | POA: Diagnosis not present

## 2021-10-05 DIAGNOSIS — M8580 Other specified disorders of bone density and structure, unspecified site: Secondary | ICD-10-CM | POA: Insufficient documentation

## 2021-10-05 DIAGNOSIS — M25672 Stiffness of left ankle, not elsewhere classified: Secondary | ICD-10-CM | POA: Diagnosis not present

## 2021-10-05 DIAGNOSIS — R293 Abnormal posture: Secondary | ICD-10-CM | POA: Diagnosis not present

## 2021-10-05 DIAGNOSIS — R5383 Other fatigue: Secondary | ICD-10-CM | POA: Diagnosis not present

## 2021-10-05 DIAGNOSIS — Z780 Asymptomatic menopausal state: Secondary | ICD-10-CM | POA: Insufficient documentation

## 2021-10-05 DIAGNOSIS — M25671 Stiffness of right ankle, not elsewhere classified: Secondary | ICD-10-CM | POA: Diagnosis not present

## 2021-10-05 DIAGNOSIS — M6281 Muscle weakness (generalized): Secondary | ICD-10-CM | POA: Diagnosis not present

## 2021-10-05 DIAGNOSIS — R2681 Unsteadiness on feet: Secondary | ICD-10-CM | POA: Diagnosis not present

## 2021-10-09 DIAGNOSIS — M25672 Stiffness of left ankle, not elsewhere classified: Secondary | ICD-10-CM | POA: Diagnosis not present

## 2021-10-09 DIAGNOSIS — R5383 Other fatigue: Secondary | ICD-10-CM | POA: Diagnosis not present

## 2021-10-09 DIAGNOSIS — R293 Abnormal posture: Secondary | ICD-10-CM | POA: Diagnosis not present

## 2021-10-09 DIAGNOSIS — M21372 Foot drop, left foot: Secondary | ICD-10-CM | POA: Diagnosis not present

## 2021-10-09 DIAGNOSIS — M6281 Muscle weakness (generalized): Secondary | ICD-10-CM | POA: Diagnosis not present

## 2021-10-09 DIAGNOSIS — R2681 Unsteadiness on feet: Secondary | ICD-10-CM | POA: Diagnosis not present

## 2021-10-09 DIAGNOSIS — M25671 Stiffness of right ankle, not elsewhere classified: Secondary | ICD-10-CM | POA: Diagnosis not present

## 2021-10-11 DIAGNOSIS — M25671 Stiffness of right ankle, not elsewhere classified: Secondary | ICD-10-CM | POA: Diagnosis not present

## 2021-10-11 DIAGNOSIS — R293 Abnormal posture: Secondary | ICD-10-CM | POA: Diagnosis not present

## 2021-10-11 DIAGNOSIS — M6281 Muscle weakness (generalized): Secondary | ICD-10-CM | POA: Diagnosis not present

## 2021-10-11 DIAGNOSIS — R5383 Other fatigue: Secondary | ICD-10-CM | POA: Diagnosis not present

## 2021-10-11 DIAGNOSIS — M21372 Foot drop, left foot: Secondary | ICD-10-CM | POA: Diagnosis not present

## 2021-10-11 DIAGNOSIS — M25672 Stiffness of left ankle, not elsewhere classified: Secondary | ICD-10-CM | POA: Diagnosis not present

## 2021-10-11 DIAGNOSIS — R2681 Unsteadiness on feet: Secondary | ICD-10-CM | POA: Diagnosis not present

## 2021-10-12 ENCOUNTER — Encounter: Payer: Self-pay | Admitting: Oncology

## 2021-10-17 DIAGNOSIS — M21372 Foot drop, left foot: Secondary | ICD-10-CM | POA: Diagnosis not present

## 2021-10-17 DIAGNOSIS — R293 Abnormal posture: Secondary | ICD-10-CM | POA: Diagnosis not present

## 2021-10-17 DIAGNOSIS — M6281 Muscle weakness (generalized): Secondary | ICD-10-CM | POA: Diagnosis not present

## 2021-10-17 DIAGNOSIS — R5383 Other fatigue: Secondary | ICD-10-CM | POA: Diagnosis not present

## 2021-10-17 DIAGNOSIS — R2681 Unsteadiness on feet: Secondary | ICD-10-CM | POA: Diagnosis not present

## 2021-10-17 DIAGNOSIS — M25672 Stiffness of left ankle, not elsewhere classified: Secondary | ICD-10-CM | POA: Diagnosis not present

## 2021-10-17 DIAGNOSIS — M25671 Stiffness of right ankle, not elsewhere classified: Secondary | ICD-10-CM | POA: Diagnosis not present

## 2021-10-25 DIAGNOSIS — M25671 Stiffness of right ankle, not elsewhere classified: Secondary | ICD-10-CM | POA: Diagnosis not present

## 2021-10-25 DIAGNOSIS — M25672 Stiffness of left ankle, not elsewhere classified: Secondary | ICD-10-CM | POA: Diagnosis not present

## 2021-10-25 DIAGNOSIS — M21372 Foot drop, left foot: Secondary | ICD-10-CM | POA: Diagnosis not present

## 2021-10-25 DIAGNOSIS — M6281 Muscle weakness (generalized): Secondary | ICD-10-CM | POA: Diagnosis not present

## 2021-10-25 DIAGNOSIS — R2681 Unsteadiness on feet: Secondary | ICD-10-CM | POA: Diagnosis not present

## 2021-10-25 DIAGNOSIS — R293 Abnormal posture: Secondary | ICD-10-CM | POA: Diagnosis not present

## 2021-10-25 DIAGNOSIS — R5383 Other fatigue: Secondary | ICD-10-CM | POA: Diagnosis not present

## 2021-10-31 DIAGNOSIS — R2681 Unsteadiness on feet: Secondary | ICD-10-CM | POA: Diagnosis not present

## 2021-10-31 DIAGNOSIS — R5383 Other fatigue: Secondary | ICD-10-CM | POA: Diagnosis not present

## 2021-10-31 DIAGNOSIS — M25672 Stiffness of left ankle, not elsewhere classified: Secondary | ICD-10-CM | POA: Diagnosis not present

## 2021-10-31 DIAGNOSIS — M25671 Stiffness of right ankle, not elsewhere classified: Secondary | ICD-10-CM | POA: Diagnosis not present

## 2021-10-31 DIAGNOSIS — R293 Abnormal posture: Secondary | ICD-10-CM | POA: Diagnosis not present

## 2021-10-31 DIAGNOSIS — M21372 Foot drop, left foot: Secondary | ICD-10-CM | POA: Diagnosis not present

## 2021-10-31 DIAGNOSIS — M6281 Muscle weakness (generalized): Secondary | ICD-10-CM | POA: Diagnosis not present

## 2021-11-02 DIAGNOSIS — R293 Abnormal posture: Secondary | ICD-10-CM | POA: Diagnosis not present

## 2021-11-02 DIAGNOSIS — M21372 Foot drop, left foot: Secondary | ICD-10-CM | POA: Diagnosis not present

## 2021-11-02 DIAGNOSIS — R5383 Other fatigue: Secondary | ICD-10-CM | POA: Diagnosis not present

## 2021-11-02 DIAGNOSIS — R2681 Unsteadiness on feet: Secondary | ICD-10-CM | POA: Diagnosis not present

## 2021-11-02 DIAGNOSIS — M6281 Muscle weakness (generalized): Secondary | ICD-10-CM | POA: Diagnosis not present

## 2021-11-02 DIAGNOSIS — M25671 Stiffness of right ankle, not elsewhere classified: Secondary | ICD-10-CM | POA: Diagnosis not present

## 2021-11-02 DIAGNOSIS — M25672 Stiffness of left ankle, not elsewhere classified: Secondary | ICD-10-CM | POA: Diagnosis not present

## 2021-11-07 DIAGNOSIS — M25671 Stiffness of right ankle, not elsewhere classified: Secondary | ICD-10-CM | POA: Diagnosis not present

## 2021-11-07 DIAGNOSIS — M25672 Stiffness of left ankle, not elsewhere classified: Secondary | ICD-10-CM | POA: Diagnosis not present

## 2021-11-07 DIAGNOSIS — R2681 Unsteadiness on feet: Secondary | ICD-10-CM | POA: Diagnosis not present

## 2021-11-07 DIAGNOSIS — R5383 Other fatigue: Secondary | ICD-10-CM | POA: Diagnosis not present

## 2021-11-07 DIAGNOSIS — M21372 Foot drop, left foot: Secondary | ICD-10-CM | POA: Diagnosis not present

## 2021-11-07 DIAGNOSIS — R293 Abnormal posture: Secondary | ICD-10-CM | POA: Diagnosis not present

## 2021-11-07 DIAGNOSIS — M6281 Muscle weakness (generalized): Secondary | ICD-10-CM | POA: Diagnosis not present

## 2021-11-14 DIAGNOSIS — M6281 Muscle weakness (generalized): Secondary | ICD-10-CM | POA: Diagnosis not present

## 2021-11-14 DIAGNOSIS — M25671 Stiffness of right ankle, not elsewhere classified: Secondary | ICD-10-CM | POA: Diagnosis not present

## 2021-11-14 DIAGNOSIS — R293 Abnormal posture: Secondary | ICD-10-CM | POA: Diagnosis not present

## 2021-11-14 DIAGNOSIS — R5383 Other fatigue: Secondary | ICD-10-CM | POA: Diagnosis not present

## 2021-11-14 DIAGNOSIS — R2681 Unsteadiness on feet: Secondary | ICD-10-CM | POA: Diagnosis not present

## 2021-11-14 DIAGNOSIS — M25672 Stiffness of left ankle, not elsewhere classified: Secondary | ICD-10-CM | POA: Diagnosis not present

## 2021-11-14 DIAGNOSIS — M21372 Foot drop, left foot: Secondary | ICD-10-CM | POA: Diagnosis not present

## 2021-11-16 DIAGNOSIS — M21372 Foot drop, left foot: Secondary | ICD-10-CM | POA: Diagnosis not present

## 2021-11-16 DIAGNOSIS — R5383 Other fatigue: Secondary | ICD-10-CM | POA: Diagnosis not present

## 2021-11-16 DIAGNOSIS — M25671 Stiffness of right ankle, not elsewhere classified: Secondary | ICD-10-CM | POA: Diagnosis not present

## 2021-11-16 DIAGNOSIS — R2681 Unsteadiness on feet: Secondary | ICD-10-CM | POA: Diagnosis not present

## 2021-11-16 DIAGNOSIS — M25672 Stiffness of left ankle, not elsewhere classified: Secondary | ICD-10-CM | POA: Diagnosis not present

## 2021-11-16 DIAGNOSIS — R293 Abnormal posture: Secondary | ICD-10-CM | POA: Diagnosis not present

## 2021-11-16 DIAGNOSIS — M6281 Muscle weakness (generalized): Secondary | ICD-10-CM | POA: Diagnosis not present

## 2021-11-21 DIAGNOSIS — M6281 Muscle weakness (generalized): Secondary | ICD-10-CM | POA: Diagnosis not present

## 2021-11-21 DIAGNOSIS — M25672 Stiffness of left ankle, not elsewhere classified: Secondary | ICD-10-CM | POA: Diagnosis not present

## 2021-11-21 DIAGNOSIS — M21372 Foot drop, left foot: Secondary | ICD-10-CM | POA: Diagnosis not present

## 2021-11-21 DIAGNOSIS — R5383 Other fatigue: Secondary | ICD-10-CM | POA: Diagnosis not present

## 2021-11-21 DIAGNOSIS — R293 Abnormal posture: Secondary | ICD-10-CM | POA: Diagnosis not present

## 2021-11-21 DIAGNOSIS — R2681 Unsteadiness on feet: Secondary | ICD-10-CM | POA: Diagnosis not present

## 2021-11-21 DIAGNOSIS — M25671 Stiffness of right ankle, not elsewhere classified: Secondary | ICD-10-CM | POA: Diagnosis not present

## 2021-11-22 DIAGNOSIS — G252 Other specified forms of tremor: Secondary | ICD-10-CM | POA: Diagnosis not present

## 2021-11-23 DIAGNOSIS — M25671 Stiffness of right ankle, not elsewhere classified: Secondary | ICD-10-CM | POA: Diagnosis not present

## 2021-11-23 DIAGNOSIS — R5383 Other fatigue: Secondary | ICD-10-CM | POA: Diagnosis not present

## 2021-11-23 DIAGNOSIS — M6281 Muscle weakness (generalized): Secondary | ICD-10-CM | POA: Diagnosis not present

## 2021-11-23 DIAGNOSIS — R293 Abnormal posture: Secondary | ICD-10-CM | POA: Diagnosis not present

## 2021-11-23 DIAGNOSIS — R2681 Unsteadiness on feet: Secondary | ICD-10-CM | POA: Diagnosis not present

## 2021-11-23 DIAGNOSIS — M25672 Stiffness of left ankle, not elsewhere classified: Secondary | ICD-10-CM | POA: Diagnosis not present

## 2021-11-23 DIAGNOSIS — M21372 Foot drop, left foot: Secondary | ICD-10-CM | POA: Diagnosis not present

## 2021-11-28 DIAGNOSIS — R293 Abnormal posture: Secondary | ICD-10-CM | POA: Diagnosis not present

## 2021-11-28 DIAGNOSIS — M6281 Muscle weakness (generalized): Secondary | ICD-10-CM | POA: Diagnosis not present

## 2021-11-28 DIAGNOSIS — M21372 Foot drop, left foot: Secondary | ICD-10-CM | POA: Diagnosis not present

## 2021-11-28 DIAGNOSIS — R5383 Other fatigue: Secondary | ICD-10-CM | POA: Diagnosis not present

## 2021-11-28 DIAGNOSIS — M25672 Stiffness of left ankle, not elsewhere classified: Secondary | ICD-10-CM | POA: Diagnosis not present

## 2021-11-28 DIAGNOSIS — M25671 Stiffness of right ankle, not elsewhere classified: Secondary | ICD-10-CM | POA: Diagnosis not present

## 2021-11-28 DIAGNOSIS — R2681 Unsteadiness on feet: Secondary | ICD-10-CM | POA: Diagnosis not present

## 2021-11-30 DIAGNOSIS — R0600 Dyspnea, unspecified: Secondary | ICD-10-CM | POA: Diagnosis not present

## 2021-11-30 DIAGNOSIS — E559 Vitamin D deficiency, unspecified: Secondary | ICD-10-CM | POA: Diagnosis not present

## 2021-11-30 DIAGNOSIS — D638 Anemia in other chronic diseases classified elsewhere: Secondary | ICD-10-CM | POA: Diagnosis not present

## 2021-11-30 DIAGNOSIS — G4733 Obstructive sleep apnea (adult) (pediatric): Secondary | ICD-10-CM | POA: Diagnosis not present

## 2021-12-05 DIAGNOSIS — M6281 Muscle weakness (generalized): Secondary | ICD-10-CM | POA: Diagnosis not present

## 2021-12-05 DIAGNOSIS — M21372 Foot drop, left foot: Secondary | ICD-10-CM | POA: Diagnosis not present

## 2021-12-05 DIAGNOSIS — R293 Abnormal posture: Secondary | ICD-10-CM | POA: Diagnosis not present

## 2021-12-05 DIAGNOSIS — R5383 Other fatigue: Secondary | ICD-10-CM | POA: Diagnosis not present

## 2021-12-05 DIAGNOSIS — M25671 Stiffness of right ankle, not elsewhere classified: Secondary | ICD-10-CM | POA: Diagnosis not present

## 2021-12-05 DIAGNOSIS — M25672 Stiffness of left ankle, not elsewhere classified: Secondary | ICD-10-CM | POA: Diagnosis not present

## 2021-12-05 DIAGNOSIS — R2681 Unsteadiness on feet: Secondary | ICD-10-CM | POA: Diagnosis not present

## 2021-12-07 DIAGNOSIS — G4710 Hypersomnia, unspecified: Secondary | ICD-10-CM | POA: Diagnosis not present

## 2021-12-07 DIAGNOSIS — G4733 Obstructive sleep apnea (adult) (pediatric): Secondary | ICD-10-CM | POA: Diagnosis not present

## 2021-12-26 DIAGNOSIS — E118 Type 2 diabetes mellitus with unspecified complications: Secondary | ICD-10-CM | POA: Diagnosis not present

## 2021-12-26 DIAGNOSIS — I10 Essential (primary) hypertension: Secondary | ICD-10-CM | POA: Diagnosis not present

## 2022-01-17 DIAGNOSIS — I10 Essential (primary) hypertension: Secondary | ICD-10-CM | POA: Diagnosis not present

## 2022-01-17 DIAGNOSIS — E118 Type 2 diabetes mellitus with unspecified complications: Secondary | ICD-10-CM | POA: Diagnosis not present

## 2022-01-17 DIAGNOSIS — M21372 Foot drop, left foot: Secondary | ICD-10-CM | POA: Diagnosis not present

## 2022-01-17 DIAGNOSIS — G20 Parkinson's disease: Secondary | ICD-10-CM | POA: Diagnosis not present

## 2022-01-17 DIAGNOSIS — Z0000 Encounter for general adult medical examination without abnormal findings: Secondary | ICD-10-CM | POA: Diagnosis not present

## 2022-01-26 DIAGNOSIS — I10 Essential (primary) hypertension: Secondary | ICD-10-CM | POA: Diagnosis not present

## 2022-01-26 DIAGNOSIS — E118 Type 2 diabetes mellitus with unspecified complications: Secondary | ICD-10-CM | POA: Diagnosis not present

## 2022-02-21 DIAGNOSIS — G252 Other specified forms of tremor: Secondary | ICD-10-CM | POA: Diagnosis not present

## 2022-02-22 DIAGNOSIS — D638 Anemia in other chronic diseases classified elsewhere: Secondary | ICD-10-CM | POA: Diagnosis not present

## 2022-02-22 DIAGNOSIS — R0600 Dyspnea, unspecified: Secondary | ICD-10-CM | POA: Diagnosis not present

## 2022-02-22 DIAGNOSIS — G4733 Obstructive sleep apnea (adult) (pediatric): Secondary | ICD-10-CM | POA: Diagnosis not present

## 2022-02-22 DIAGNOSIS — E559 Vitamin D deficiency, unspecified: Secondary | ICD-10-CM | POA: Diagnosis not present

## 2022-03-23 DIAGNOSIS — G4733 Obstructive sleep apnea (adult) (pediatric): Secondary | ICD-10-CM | POA: Diagnosis not present

## 2022-03-23 DIAGNOSIS — G4710 Hypersomnia, unspecified: Secondary | ICD-10-CM | POA: Diagnosis not present

## 2022-04-09 ENCOUNTER — Other Ambulatory Visit: Payer: Self-pay | Admitting: Oncology

## 2022-04-09 DIAGNOSIS — C50411 Malignant neoplasm of upper-outer quadrant of right female breast: Secondary | ICD-10-CM

## 2022-04-17 DIAGNOSIS — E119 Type 2 diabetes mellitus without complications: Secondary | ICD-10-CM | POA: Diagnosis not present

## 2022-04-17 DIAGNOSIS — Z7984 Long term (current) use of oral hypoglycemic drugs: Secondary | ICD-10-CM | POA: Diagnosis not present

## 2022-04-17 DIAGNOSIS — B351 Tinea unguium: Secondary | ICD-10-CM | POA: Diagnosis not present

## 2022-04-17 DIAGNOSIS — I872 Venous insufficiency (chronic) (peripheral): Secondary | ICD-10-CM | POA: Diagnosis not present

## 2022-04-25 DIAGNOSIS — G252 Other specified forms of tremor: Secondary | ICD-10-CM | POA: Diagnosis not present

## 2022-05-17 DIAGNOSIS — D638 Anemia in other chronic diseases classified elsewhere: Secondary | ICD-10-CM | POA: Diagnosis not present

## 2022-05-17 DIAGNOSIS — G4733 Obstructive sleep apnea (adult) (pediatric): Secondary | ICD-10-CM | POA: Diagnosis not present

## 2022-05-17 DIAGNOSIS — E559 Vitamin D deficiency, unspecified: Secondary | ICD-10-CM | POA: Diagnosis not present

## 2022-05-17 DIAGNOSIS — R0600 Dyspnea, unspecified: Secondary | ICD-10-CM | POA: Diagnosis not present

## 2022-06-15 DIAGNOSIS — G4733 Obstructive sleep apnea (adult) (pediatric): Secondary | ICD-10-CM | POA: Diagnosis not present

## 2022-06-15 DIAGNOSIS — G4710 Hypersomnia, unspecified: Secondary | ICD-10-CM | POA: Diagnosis not present

## 2022-06-19 DIAGNOSIS — G243 Spasmodic torticollis: Secondary | ICD-10-CM | POA: Diagnosis not present

## 2022-06-19 DIAGNOSIS — G20 Parkinson's disease: Secondary | ICD-10-CM | POA: Diagnosis not present

## 2022-06-19 DIAGNOSIS — Z8616 Personal history of COVID-19: Secondary | ICD-10-CM | POA: Diagnosis not present

## 2022-06-19 DIAGNOSIS — J45909 Unspecified asthma, uncomplicated: Secondary | ICD-10-CM | POA: Diagnosis not present

## 2022-06-19 DIAGNOSIS — Z853 Personal history of malignant neoplasm of breast: Secondary | ICD-10-CM | POA: Diagnosis not present

## 2022-06-19 DIAGNOSIS — Z9889 Other specified postprocedural states: Secondary | ICD-10-CM | POA: Diagnosis not present

## 2022-06-19 DIAGNOSIS — F32A Depression, unspecified: Secondary | ICD-10-CM | POA: Diagnosis not present

## 2022-06-19 DIAGNOSIS — G4700 Insomnia, unspecified: Secondary | ICD-10-CM | POA: Diagnosis not present

## 2022-06-19 DIAGNOSIS — G4733 Obstructive sleep apnea (adult) (pediatric): Secondary | ICD-10-CM | POA: Diagnosis not present

## 2022-06-19 DIAGNOSIS — Z9181 History of falling: Secondary | ICD-10-CM | POA: Diagnosis not present

## 2022-06-28 DIAGNOSIS — E118 Type 2 diabetes mellitus with unspecified complications: Secondary | ICD-10-CM | POA: Diagnosis not present

## 2022-06-28 DIAGNOSIS — I10 Essential (primary) hypertension: Secondary | ICD-10-CM | POA: Diagnosis not present

## 2022-07-20 DIAGNOSIS — D638 Anemia in other chronic diseases classified elsewhere: Secondary | ICD-10-CM | POA: Diagnosis not present

## 2022-07-20 DIAGNOSIS — G4733 Obstructive sleep apnea (adult) (pediatric): Secondary | ICD-10-CM | POA: Diagnosis not present

## 2022-07-20 DIAGNOSIS — R0600 Dyspnea, unspecified: Secondary | ICD-10-CM | POA: Diagnosis not present

## 2022-08-07 DIAGNOSIS — G20C Parkinsonism, unspecified: Secondary | ICD-10-CM | POA: Diagnosis not present

## 2022-08-07 DIAGNOSIS — R251 Tremor, unspecified: Secondary | ICD-10-CM | POA: Diagnosis not present

## 2022-08-07 DIAGNOSIS — G603 Idiopathic progressive neuropathy: Secondary | ICD-10-CM | POA: Diagnosis not present

## 2022-08-09 DIAGNOSIS — G4733 Obstructive sleep apnea (adult) (pediatric): Secondary | ICD-10-CM | POA: Diagnosis not present

## 2022-08-09 DIAGNOSIS — R0600 Dyspnea, unspecified: Secondary | ICD-10-CM | POA: Diagnosis not present

## 2022-08-09 DIAGNOSIS — D638 Anemia in other chronic diseases classified elsewhere: Secondary | ICD-10-CM | POA: Diagnosis not present

## 2022-08-09 DIAGNOSIS — E559 Vitamin D deficiency, unspecified: Secondary | ICD-10-CM | POA: Diagnosis not present

## 2022-08-09 DIAGNOSIS — R5383 Other fatigue: Secondary | ICD-10-CM | POA: Diagnosis not present

## 2022-08-09 DIAGNOSIS — Z23 Encounter for immunization: Secondary | ICD-10-CM | POA: Diagnosis not present

## 2022-08-23 DIAGNOSIS — R251 Tremor, unspecified: Secondary | ICD-10-CM | POA: Diagnosis not present

## 2022-08-23 DIAGNOSIS — E118 Type 2 diabetes mellitus with unspecified complications: Secondary | ICD-10-CM | POA: Diagnosis not present

## 2022-08-23 DIAGNOSIS — D649 Anemia, unspecified: Secondary | ICD-10-CM | POA: Diagnosis not present

## 2022-08-28 DIAGNOSIS — I10 Essential (primary) hypertension: Secondary | ICD-10-CM | POA: Diagnosis not present

## 2022-08-28 DIAGNOSIS — H401131 Primary open-angle glaucoma, bilateral, mild stage: Secondary | ICD-10-CM | POA: Diagnosis not present

## 2022-08-28 DIAGNOSIS — E118 Type 2 diabetes mellitus with unspecified complications: Secondary | ICD-10-CM | POA: Diagnosis not present

## 2022-09-13 DIAGNOSIS — G4733 Obstructive sleep apnea (adult) (pediatric): Secondary | ICD-10-CM | POA: Diagnosis not present

## 2022-09-13 DIAGNOSIS — D638 Anemia in other chronic diseases classified elsewhere: Secondary | ICD-10-CM | POA: Diagnosis not present

## 2022-09-13 DIAGNOSIS — E559 Vitamin D deficiency, unspecified: Secondary | ICD-10-CM | POA: Diagnosis not present

## 2022-09-13 DIAGNOSIS — R0600 Dyspnea, unspecified: Secondary | ICD-10-CM | POA: Diagnosis not present

## 2022-09-14 DIAGNOSIS — G4733 Obstructive sleep apnea (adult) (pediatric): Secondary | ICD-10-CM | POA: Diagnosis not present

## 2022-09-14 DIAGNOSIS — G4710 Hypersomnia, unspecified: Secondary | ICD-10-CM | POA: Diagnosis not present

## 2022-09-28 ENCOUNTER — Encounter: Payer: Self-pay | Admitting: Oncology

## 2022-09-28 ENCOUNTER — Inpatient Hospital Stay: Payer: Medicare Other | Attending: Oncology | Admitting: Oncology

## 2022-09-28 ENCOUNTER — Other Ambulatory Visit: Payer: Self-pay | Admitting: Oncology

## 2022-09-28 ENCOUNTER — Telehealth: Payer: Self-pay | Admitting: Oncology

## 2022-09-28 VITALS — BP 165/77 | HR 74 | Temp 97.8°F | Resp 18 | Ht 66.0 in | Wt 257.9 lb

## 2022-09-28 DIAGNOSIS — C50411 Malignant neoplasm of upper-outer quadrant of right female breast: Secondary | ICD-10-CM

## 2022-09-28 DIAGNOSIS — Z170 Estrogen receptor positive status [ER+]: Secondary | ICD-10-CM | POA: Diagnosis not present

## 2022-09-28 DIAGNOSIS — Z780 Asymptomatic menopausal state: Secondary | ICD-10-CM

## 2022-09-28 DIAGNOSIS — M8580 Other specified disorders of bone density and structure, unspecified site: Secondary | ICD-10-CM | POA: Diagnosis not present

## 2022-09-28 NOTE — Progress Notes (Signed)
[facility]  [address] [place],  [place]  [address] [phone]  Clinic Day:  09/28/22  Referring physician: Ronita Hipps, MD  CHIEF COMPLAINT:  CC: History of stage IIA invasive ductal carcinoma of the right breast  Current Treatment:  Anastrozole 1 mg daily for a total of 10 years   HISTORY OF PRESENT ILLNESS:  Stephanie Anderson is a 71 y.o. female with a history of stage IIA (T2N0M0) invasive ductal carcinoma in the upper outer quadrant of the right breast in July 2017.  This was treated with lumpectomy then adjuvant radiation, and once completed, she was placed on anastrozole in September. She has not undergone genetic testing, but has no family history.  She transferred her care after moving from [place] last October 2020.  She underwent menarche around age 74, and went through menopause in her 85's.  She was never placed on hormones.  She has never had children. Bone density from October 2020 revealed osteopenia of the hip.    INTERVAL HISTORY:  Stephanie Anderson is here for routine follow up and continues to have shakes and tremors consistent with Parkinson's disease, and uses a cane to ambulate. She completed 5 years of anastrozole in September 2022, but asks about continuing for a total of 10 years. I am in agreement with this as she was a stage II and her bone density remains stable. Annual bilateral mammogram from November was clear. Bone density from October of 2022 revealed osteopenia with a T-score of  -1.7 of the left femur, previously -1.6. Dual femur total mean is considered normal at -0.6, previously -0.5. Left forearm measures -1.7, previously -0.4. Her  appetite is good, and she has gained 8 pounds since her last visit.  She denies fever, chills or other signs of infection.  She denies nausea, vomiting, bowel issues, or abdominal pain.  She denies sore throat, cough, dyspnea, or chest pain. She is up to date on colonoscopy and EGD with Dr.  Lyda Jester as of October 2022. Colonoscopy revealed two polyps and pathology did reveal one to be a tubular adenoma of the sigmoid colon.  She has not had her annual mammogram for this year and so I will get that scheduled now.  She is using compression hose on her lower extremities.  REVIEW OF SYSTEMS:  Review of Systems  Constitutional: Negative.  Negative for appetite change, chills, fatigue, fever and unexpected weight change.  HENT:  Negative.    Eyes: Negative.   Respiratory: Negative.  Negative for chest tightness, cough, hemoptysis, shortness of breath and wheezing.   Cardiovascular: Negative.  Negative for chest pain, leg swelling and palpitations.  Gastrointestinal: Negative.  Negative for abdominal distention, abdominal pain, blood in stool, constipation, diarrhea, nausea and vomiting.  Endocrine: Negative.   Genitourinary: Negative.  Negative for difficulty urinating, dysuria, frequency and hematuria.   Musculoskeletal:  Positive for gait problem (uses a cane to ambulate). Negative for arthralgias, back pain, flank pain and myalgias.  Skin: Negative.   Neurological:  Positive for gait problem (uses a cane to ambulate). Negative for dizziness, extremity weakness, headaches, light-headedness, numbness, seizures and speech difficulty.       Tremors, stable  Hematological: Negative.   Psychiatric/Behavioral: Negative.  Negative for depression and sleep disturbance. The patient is not nervous/anxious.      VITALS:  Blood pressure (!) 165/77, pulse 74, temperature 97.8 F (36.6 C), temperature source Oral, resp. rate 18, height '5\' 6"'$  (1.676 m), weight 257 lb 14.4  oz (117 kg), SpO2 97 %.  Wt Readings from Last 3 Encounters:  09/28/22 257 lb 14.4 oz (117 kg)  09/28/21 249 lb 12.8 oz (113.3 kg)  12/08/20 246 lb 3.2 oz (111.7 kg)    Body mass index is 41.63 kg/m.  Performance status (ECOG): 1 - Symptomatic but completely ambulatory  PHYSICAL EXAM:  Physical Exam Constitutional:       General: She is not in acute distress.    Appearance: Normal appearance. She is normal weight.  HENT:     Head: Normocephalic and atraumatic.  Eyes:     General: No scleral icterus.    Extraocular Movements: Extraocular movements intact.     Conjunctiva/sclera: Conjunctivae normal.     Pupils: Pupils are equal, round, and reactive to light.  Cardiovascular:     Rate and Rhythm: Normal rate and regular rhythm.     Pulses: Normal pulses.     Heart sounds: Normal heart sounds. No murmur heard.    No friction rub. No gallop.  Pulmonary:     Effort: Pulmonary effort is normal. No respiratory distress.     Breath sounds: Normal breath sounds.  Chest:     Comments: Deep well healed scar in the upper outer quadrant of the right breast. Fibrocystic changes in the bottom half of the left breast. Both breasts are without masses. Abdominal:     General: Bowel sounds are normal. There is no distension.     Palpations: Abdomen is soft. There is no hepatomegaly, splenomegaly or mass.     Tenderness: There is no abdominal tenderness.  Musculoskeletal:        General: Normal range of motion.     Cervical back: Normal range of motion and neck supple.     Right lower leg: No edema.     Left lower leg: No edema.     Comments: She is wearing compression hose and has a brace on her left ankle  Lymphadenopathy:     Cervical: No cervical adenopathy.  Skin:    General: Skin is warm and dry.  Neurological:     General: No focal deficit present.     Mental Status: She is alert and oriented to person, place, and time. Mental status is at baseline.     Motor: Tremor (constant and bradykinesia) present.  Psychiatric:        Mood and Affect: Mood normal.        Behavior: Behavior normal.        Thought Content: Thought content normal.        Judgment: Judgment normal.    LABS:       No data to display             No data to display           STUDIES:   EXAM: 08/18/2021 DUAL X-RAY  ABSORPTIOMETRY (DXA) FOR BONE MINERAL DENSITY  IMPRESSION: Stephanie Anderson completed a BMD test on 08/18/2021 using the [employer] (analysis version: 13.60) manufactured by [employer]. The following summarizes the results of our evaluation. TECHNOLOGIST: Loman Chroman PATIENT BIOGRAPHICAL: Name: Stephanie Anderson, Stephanie Anderson Patient ID: [id number] Birth Date: 12-06-1950 Height: 66.0 in. Gender: Female Exam Date: 08/18/2021 Weight: 248.0 lbs. Indications:M85.89 Secondary Osteoporosis Fractures: Treatments:  ASSESSMENT: The BMD measured at Femur Neck Left is 0.795 g/cm2 with a T-score of -1.7. This patient is considered osteopenic according to [facility]) criteria. The scan quality is good. Lumbar spine was not utilized due to advanced  degenerative changes. Site Region Measured Measured WHO Young Adult BMD Date Age Classification T-score DualFemur Neck Left 08/18/2021 70.6 Osteopenia -1.7 0.795 g/cm2 DualFemur Neck Left 08/17/2019 68.6 Osteopenia -1.6 0.813 g/cm2  DualFemur Total Mean 08/18/2021 70.6 Normal -0.6 0.932 g/cm2 DualFemur Total Mean 08/17/2019 68.6 Normal -0.5 0.949 g/cm2  Left Forearm Radius 33% 08/18/2021 70.6 Osteopenia -1.7 0.731 g/cm2 Left Forearm Radius 33% 08/17/2019 68.6 Normal -0.4 0.841 g/cm2  EXAM: 09/25/2021 DIGITAL DIAGNOSTIC BILATERAL MAMMOGRAM WITH TOMOSYNTHESIS AND CAD  TECHNIQUE: Bilateral digital diagnostic mammography and breast tomosynthesis was performed. The images were evaluated with computer-aided detection.  COMPARISON: Previous exam(s).  ACR Breast Density Category b: There are scattered areas of fibroglandular density.  FINDINGS: Stable post lumpectomy changes over the upper outer right breast. Remainder of the right breast as well as the left breast is unchanged.  IMPRESSION: Stable post lumpectomy changes of the right breast.  Allergies:  Allergies  Allergen Reactions  . Furosemide Other (See Comments)    Abdominal  pain Abdominal pain  . Rasagiline Nausea And Vomiting    Current Medications: Current Outpatient Medications  Medication Sig Dispense Refill  . albuterol (PROVENTIL) (2.5 MG/3ML) 0.083% nebulizer solution Take 2.5 mg by nebulization 4 (four) times daily.    Marland Kitchen losartan (COZAAR) 100 MG tablet Take 100 mg by mouth daily.    . Vitamin D, Ergocalciferol, (DRISDOL) 1.25 MG (50000 UNIT) CAPS capsule Take 50,000 Units by mouth once a week.    Marland Kitchen anastrozole (ARIMIDEX) 1 MG tablet TAKE ONE TABLET BY MOUTH EVERY MORNING 90 tablet 3  . atenolol (TENORMIN) 25 MG tablet Take by mouth daily. 1 tab every morning    . budesonide (PULMICORT) 0.5 MG/2ML nebulizer solution SMARTSIG:1 Vial(s) Via Nebulizer Every 12 Hours    . carbidopa-levodopa (SINEMET IR) 25-100 MG tablet Take 1 tablet by mouth 3 (three) times daily.    . hydrochlorothiazide (HYDRODIURIL) 50 MG tablet Take 50 mg by mouth daily. 1 tablet every morning    . potassium chloride (KLOR-CON) 10 MEQ tablet Take 10 mEq by mouth daily.    . rosuvastatin (CRESTOR) 20 MG tablet Take 20 mg by mouth at bedtime.     No current facility-administered medications for this visit.     ASSESSMENT & PLAN:   Assessment:   1.  History of stage II (T2N0M0) invasive ductal carcinoma in 2017, treated with lumpectomy and radiation.  She was then placed on anastrozole, and completed 5 years in September 2022. She wishes to continue this for 10 years and as long as there is no worsening on her bone density, I am in agreement.  2.  Tremors, this has not been diagnosed as Parkinson's, but seems very suggestive of this.     3.  Osteopenia, fairly stable.  She will be due for repeat bone density in October 2024.  4.  Tubular adenoma of the sigmoid colon, October 2022.  Plan: At the patient's request, we will continue anastrozole for a total of 10 years.  I will schedule her for her annual mammogram at it as it is just due now.  I will plan to see her back in 1 year  with bilateral screening mammogram and bone density scan.  She understands and agrees with this plan of care.   I provided 20 minutes of face-to-face time during this this encounter and > 50% was spent counseling as documented under my assessment and plan.    Derwood Kaplan, MD [place]  [place] [address] Dept: [phone] Dept Fax: [phone]

## 2022-09-28 NOTE — Telephone Encounter (Signed)
09/28/22 next appt scheduled and confirmed with patient

## 2022-10-12 DIAGNOSIS — J4520 Mild intermittent asthma, uncomplicated: Secondary | ICD-10-CM | POA: Diagnosis not present

## 2022-11-02 DIAGNOSIS — J4520 Mild intermittent asthma, uncomplicated: Secondary | ICD-10-CM | POA: Diagnosis not present

## 2022-11-14 DIAGNOSIS — Z1231 Encounter for screening mammogram for malignant neoplasm of breast: Secondary | ICD-10-CM | POA: Diagnosis not present

## 2022-11-16 ENCOUNTER — Encounter: Payer: Self-pay | Admitting: Oncology

## 2022-11-27 DIAGNOSIS — J4520 Mild intermittent asthma, uncomplicated: Secondary | ICD-10-CM | POA: Diagnosis not present

## 2022-11-28 DIAGNOSIS — E7800 Pure hypercholesterolemia, unspecified: Secondary | ICD-10-CM | POA: Diagnosis not present

## 2022-11-28 DIAGNOSIS — E118 Type 2 diabetes mellitus with unspecified complications: Secondary | ICD-10-CM | POA: Diagnosis not present

## 2022-11-28 DIAGNOSIS — I10 Essential (primary) hypertension: Secondary | ICD-10-CM | POA: Diagnosis not present

## 2022-12-06 DIAGNOSIS — G4733 Obstructive sleep apnea (adult) (pediatric): Secondary | ICD-10-CM | POA: Diagnosis not present

## 2022-12-06 DIAGNOSIS — G4710 Hypersomnia, unspecified: Secondary | ICD-10-CM | POA: Diagnosis not present

## 2022-12-13 DIAGNOSIS — E559 Vitamin D deficiency, unspecified: Secondary | ICD-10-CM | POA: Diagnosis not present

## 2022-12-13 DIAGNOSIS — R0600 Dyspnea, unspecified: Secondary | ICD-10-CM | POA: Diagnosis not present

## 2022-12-13 DIAGNOSIS — R6889 Other general symptoms and signs: Secondary | ICD-10-CM | POA: Diagnosis not present

## 2022-12-13 DIAGNOSIS — G4733 Obstructive sleep apnea (adult) (pediatric): Secondary | ICD-10-CM | POA: Diagnosis not present

## 2022-12-13 DIAGNOSIS — D638 Anemia in other chronic diseases classified elsewhere: Secondary | ICD-10-CM | POA: Diagnosis not present

## 2022-12-17 DIAGNOSIS — J4520 Mild intermittent asthma, uncomplicated: Secondary | ICD-10-CM | POA: Diagnosis not present

## 2023-01-10 DIAGNOSIS — J4520 Mild intermittent asthma, uncomplicated: Secondary | ICD-10-CM | POA: Diagnosis not present

## 2023-01-29 DIAGNOSIS — G243 Spasmodic torticollis: Secondary | ICD-10-CM | POA: Diagnosis not present

## 2023-01-29 DIAGNOSIS — R251 Tremor, unspecified: Secondary | ICD-10-CM | POA: Diagnosis not present

## 2023-01-29 DIAGNOSIS — G20C Parkinsonism, unspecified: Secondary | ICD-10-CM | POA: Diagnosis not present

## 2023-01-31 DIAGNOSIS — J4520 Mild intermittent asthma, uncomplicated: Secondary | ICD-10-CM | POA: Diagnosis not present

## 2023-02-13 DIAGNOSIS — E118 Type 2 diabetes mellitus with unspecified complications: Secondary | ICD-10-CM | POA: Diagnosis not present

## 2023-02-13 DIAGNOSIS — Z0000 Encounter for general adult medical examination without abnormal findings: Secondary | ICD-10-CM | POA: Diagnosis not present

## 2023-02-13 DIAGNOSIS — M25372 Other instability, left ankle: Secondary | ICD-10-CM | POA: Diagnosis not present

## 2023-02-13 DIAGNOSIS — Z79899 Other long term (current) drug therapy: Secondary | ICD-10-CM | POA: Diagnosis not present

## 2023-02-21 DIAGNOSIS — J4520 Mild intermittent asthma, uncomplicated: Secondary | ICD-10-CM | POA: Diagnosis not present

## 2023-02-24 DIAGNOSIS — G4733 Obstructive sleep apnea (adult) (pediatric): Secondary | ICD-10-CM | POA: Diagnosis not present

## 2023-02-24 DIAGNOSIS — G4710 Hypersomnia, unspecified: Secondary | ICD-10-CM | POA: Diagnosis not present

## 2023-03-04 DIAGNOSIS — G4733 Obstructive sleep apnea (adult) (pediatric): Secondary | ICD-10-CM | POA: Diagnosis not present

## 2023-03-13 DIAGNOSIS — M25572 Pain in left ankle and joints of left foot: Secondary | ICD-10-CM | POA: Diagnosis not present

## 2023-03-18 DIAGNOSIS — M19072 Primary osteoarthritis, left ankle and foot: Secondary | ICD-10-CM | POA: Diagnosis not present

## 2023-03-19 DIAGNOSIS — J4520 Mild intermittent asthma, uncomplicated: Secondary | ICD-10-CM | POA: Diagnosis not present

## 2023-03-21 DIAGNOSIS — R0600 Dyspnea, unspecified: Secondary | ICD-10-CM | POA: Diagnosis not present

## 2023-03-21 DIAGNOSIS — E559 Vitamin D deficiency, unspecified: Secondary | ICD-10-CM | POA: Diagnosis not present

## 2023-03-21 DIAGNOSIS — G4733 Obstructive sleep apnea (adult) (pediatric): Secondary | ICD-10-CM | POA: Diagnosis not present

## 2023-03-28 DIAGNOSIS — M19072 Primary osteoarthritis, left ankle and foot: Secondary | ICD-10-CM | POA: Diagnosis not present

## 2023-04-10 DIAGNOSIS — J4520 Mild intermittent asthma, uncomplicated: Secondary | ICD-10-CM | POA: Diagnosis not present

## 2023-04-16 DIAGNOSIS — R293 Abnormal posture: Secondary | ICD-10-CM | POA: Diagnosis not present

## 2023-04-16 DIAGNOSIS — M25672 Stiffness of left ankle, not elsewhere classified: Secondary | ICD-10-CM | POA: Diagnosis not present

## 2023-04-16 DIAGNOSIS — M19079 Primary osteoarthritis, unspecified ankle and foot: Secondary | ICD-10-CM | POA: Diagnosis not present

## 2023-04-16 DIAGNOSIS — R2681 Unsteadiness on feet: Secondary | ICD-10-CM | POA: Diagnosis not present

## 2023-04-16 DIAGNOSIS — M6281 Muscle weakness (generalized): Secondary | ICD-10-CM | POA: Diagnosis not present

## 2023-04-16 DIAGNOSIS — M25671 Stiffness of right ankle, not elsewhere classified: Secondary | ICD-10-CM | POA: Diagnosis not present

## 2023-04-18 ENCOUNTER — Other Ambulatory Visit: Payer: Self-pay | Admitting: Oncology

## 2023-04-18 DIAGNOSIS — Z7984 Long term (current) use of oral hypoglycemic drugs: Secondary | ICD-10-CM | POA: Diagnosis not present

## 2023-04-18 DIAGNOSIS — Z170 Estrogen receptor positive status [ER+]: Secondary | ICD-10-CM

## 2023-04-18 DIAGNOSIS — B351 Tinea unguium: Secondary | ICD-10-CM | POA: Diagnosis not present

## 2023-04-18 DIAGNOSIS — E119 Type 2 diabetes mellitus without complications: Secondary | ICD-10-CM | POA: Diagnosis not present

## 2023-04-24 DIAGNOSIS — M25672 Stiffness of left ankle, not elsewhere classified: Secondary | ICD-10-CM | POA: Diagnosis not present

## 2023-04-24 DIAGNOSIS — H401134 Primary open-angle glaucoma, bilateral, indeterminate stage: Secondary | ICD-10-CM | POA: Diagnosis not present

## 2023-04-24 DIAGNOSIS — M19079 Primary osteoarthritis, unspecified ankle and foot: Secondary | ICD-10-CM | POA: Diagnosis not present

## 2023-04-24 DIAGNOSIS — R293 Abnormal posture: Secondary | ICD-10-CM | POA: Diagnosis not present

## 2023-04-24 DIAGNOSIS — M6281 Muscle weakness (generalized): Secondary | ICD-10-CM | POA: Diagnosis not present

## 2023-04-24 DIAGNOSIS — R2681 Unsteadiness on feet: Secondary | ICD-10-CM | POA: Diagnosis not present

## 2023-04-24 DIAGNOSIS — M25671 Stiffness of right ankle, not elsewhere classified: Secondary | ICD-10-CM | POA: Diagnosis not present

## 2023-04-26 DIAGNOSIS — M25672 Stiffness of left ankle, not elsewhere classified: Secondary | ICD-10-CM | POA: Diagnosis not present

## 2023-04-26 DIAGNOSIS — M25671 Stiffness of right ankle, not elsewhere classified: Secondary | ICD-10-CM | POA: Diagnosis not present

## 2023-04-26 DIAGNOSIS — M6281 Muscle weakness (generalized): Secondary | ICD-10-CM | POA: Diagnosis not present

## 2023-04-26 DIAGNOSIS — R293 Abnormal posture: Secondary | ICD-10-CM | POA: Diagnosis not present

## 2023-04-26 DIAGNOSIS — R2681 Unsteadiness on feet: Secondary | ICD-10-CM | POA: Diagnosis not present

## 2023-04-26 DIAGNOSIS — M19079 Primary osteoarthritis, unspecified ankle and foot: Secondary | ICD-10-CM | POA: Diagnosis not present

## 2023-05-01 DIAGNOSIS — M25671 Stiffness of right ankle, not elsewhere classified: Secondary | ICD-10-CM | POA: Diagnosis not present

## 2023-05-01 DIAGNOSIS — M25672 Stiffness of left ankle, not elsewhere classified: Secondary | ICD-10-CM | POA: Diagnosis not present

## 2023-05-01 DIAGNOSIS — M19079 Primary osteoarthritis, unspecified ankle and foot: Secondary | ICD-10-CM | POA: Diagnosis not present

## 2023-05-01 DIAGNOSIS — R293 Abnormal posture: Secondary | ICD-10-CM | POA: Diagnosis not present

## 2023-05-01 DIAGNOSIS — R2681 Unsteadiness on feet: Secondary | ICD-10-CM | POA: Diagnosis not present

## 2023-05-06 DIAGNOSIS — M19079 Primary osteoarthritis, unspecified ankle and foot: Secondary | ICD-10-CM | POA: Diagnosis not present

## 2023-05-06 DIAGNOSIS — R293 Abnormal posture: Secondary | ICD-10-CM | POA: Diagnosis not present

## 2023-05-06 DIAGNOSIS — M25671 Stiffness of right ankle, not elsewhere classified: Secondary | ICD-10-CM | POA: Diagnosis not present

## 2023-05-06 DIAGNOSIS — M25672 Stiffness of left ankle, not elsewhere classified: Secondary | ICD-10-CM | POA: Diagnosis not present

## 2023-05-06 DIAGNOSIS — R2681 Unsteadiness on feet: Secondary | ICD-10-CM | POA: Diagnosis not present

## 2023-05-10 DIAGNOSIS — R2681 Unsteadiness on feet: Secondary | ICD-10-CM | POA: Diagnosis not present

## 2023-05-10 DIAGNOSIS — M25672 Stiffness of left ankle, not elsewhere classified: Secondary | ICD-10-CM | POA: Diagnosis not present

## 2023-05-10 DIAGNOSIS — M19079 Primary osteoarthritis, unspecified ankle and foot: Secondary | ICD-10-CM | POA: Diagnosis not present

## 2023-05-10 DIAGNOSIS — M25671 Stiffness of right ankle, not elsewhere classified: Secondary | ICD-10-CM | POA: Diagnosis not present

## 2023-05-10 DIAGNOSIS — R293 Abnormal posture: Secondary | ICD-10-CM | POA: Diagnosis not present

## 2023-05-13 DIAGNOSIS — M25671 Stiffness of right ankle, not elsewhere classified: Secondary | ICD-10-CM | POA: Diagnosis not present

## 2023-05-13 DIAGNOSIS — M25672 Stiffness of left ankle, not elsewhere classified: Secondary | ICD-10-CM | POA: Diagnosis not present

## 2023-05-13 DIAGNOSIS — M19079 Primary osteoarthritis, unspecified ankle and foot: Secondary | ICD-10-CM | POA: Diagnosis not present

## 2023-05-13 DIAGNOSIS — R2681 Unsteadiness on feet: Secondary | ICD-10-CM | POA: Diagnosis not present

## 2023-05-13 DIAGNOSIS — R293 Abnormal posture: Secondary | ICD-10-CM | POA: Diagnosis not present

## 2023-05-15 DIAGNOSIS — G4733 Obstructive sleep apnea (adult) (pediatric): Secondary | ICD-10-CM | POA: Diagnosis not present

## 2023-05-15 DIAGNOSIS — G4710 Hypersomnia, unspecified: Secondary | ICD-10-CM | POA: Diagnosis not present

## 2023-05-16 DIAGNOSIS — R2681 Unsteadiness on feet: Secondary | ICD-10-CM | POA: Diagnosis not present

## 2023-05-16 DIAGNOSIS — R293 Abnormal posture: Secondary | ICD-10-CM | POA: Diagnosis not present

## 2023-05-16 DIAGNOSIS — M25672 Stiffness of left ankle, not elsewhere classified: Secondary | ICD-10-CM | POA: Diagnosis not present

## 2023-05-16 DIAGNOSIS — M19079 Primary osteoarthritis, unspecified ankle and foot: Secondary | ICD-10-CM | POA: Diagnosis not present

## 2023-05-16 DIAGNOSIS — M25671 Stiffness of right ankle, not elsewhere classified: Secondary | ICD-10-CM | POA: Diagnosis not present

## 2023-05-20 DIAGNOSIS — M25672 Stiffness of left ankle, not elsewhere classified: Secondary | ICD-10-CM | POA: Diagnosis not present

## 2023-05-20 DIAGNOSIS — M19079 Primary osteoarthritis, unspecified ankle and foot: Secondary | ICD-10-CM | POA: Diagnosis not present

## 2023-05-20 DIAGNOSIS — M25671 Stiffness of right ankle, not elsewhere classified: Secondary | ICD-10-CM | POA: Diagnosis not present

## 2023-05-20 DIAGNOSIS — R293 Abnormal posture: Secondary | ICD-10-CM | POA: Diagnosis not present

## 2023-05-20 DIAGNOSIS — R2681 Unsteadiness on feet: Secondary | ICD-10-CM | POA: Diagnosis not present

## 2023-05-24 DIAGNOSIS — R2681 Unsteadiness on feet: Secondary | ICD-10-CM | POA: Diagnosis not present

## 2023-05-24 DIAGNOSIS — R293 Abnormal posture: Secondary | ICD-10-CM | POA: Diagnosis not present

## 2023-05-24 DIAGNOSIS — M25671 Stiffness of right ankle, not elsewhere classified: Secondary | ICD-10-CM | POA: Diagnosis not present

## 2023-05-24 DIAGNOSIS — M25672 Stiffness of left ankle, not elsewhere classified: Secondary | ICD-10-CM | POA: Diagnosis not present

## 2023-05-24 DIAGNOSIS — M19079 Primary osteoarthritis, unspecified ankle and foot: Secondary | ICD-10-CM | POA: Diagnosis not present

## 2023-05-27 DIAGNOSIS — M25671 Stiffness of right ankle, not elsewhere classified: Secondary | ICD-10-CM | POA: Diagnosis not present

## 2023-05-27 DIAGNOSIS — M19079 Primary osteoarthritis, unspecified ankle and foot: Secondary | ICD-10-CM | POA: Diagnosis not present

## 2023-05-27 DIAGNOSIS — M25672 Stiffness of left ankle, not elsewhere classified: Secondary | ICD-10-CM | POA: Diagnosis not present

## 2023-05-27 DIAGNOSIS — R2681 Unsteadiness on feet: Secondary | ICD-10-CM | POA: Diagnosis not present

## 2023-05-27 DIAGNOSIS — R293 Abnormal posture: Secondary | ICD-10-CM | POA: Diagnosis not present

## 2023-05-29 DIAGNOSIS — E7800 Pure hypercholesterolemia, unspecified: Secondary | ICD-10-CM | POA: Diagnosis not present

## 2023-05-29 DIAGNOSIS — M19079 Primary osteoarthritis, unspecified ankle and foot: Secondary | ICD-10-CM | POA: Diagnosis not present

## 2023-05-29 DIAGNOSIS — Y708 Miscellaneous anesthesiology devices associated with adverse incidents, not elsewhere classified: Secondary | ICD-10-CM | POA: Diagnosis not present

## 2023-05-29 DIAGNOSIS — I10 Essential (primary) hypertension: Secondary | ICD-10-CM | POA: Diagnosis not present

## 2023-05-29 DIAGNOSIS — E118 Type 2 diabetes mellitus with unspecified complications: Secondary | ICD-10-CM | POA: Diagnosis not present

## 2023-05-30 DIAGNOSIS — R0600 Dyspnea, unspecified: Secondary | ICD-10-CM | POA: Diagnosis not present

## 2023-05-30 DIAGNOSIS — G4733 Obstructive sleep apnea (adult) (pediatric): Secondary | ICD-10-CM | POA: Diagnosis not present

## 2023-05-30 DIAGNOSIS — R5383 Other fatigue: Secondary | ICD-10-CM | POA: Diagnosis not present

## 2023-05-31 DIAGNOSIS — M6281 Muscle weakness (generalized): Secondary | ICD-10-CM | POA: Diagnosis not present

## 2023-05-31 DIAGNOSIS — M25671 Stiffness of right ankle, not elsewhere classified: Secondary | ICD-10-CM | POA: Diagnosis not present

## 2023-05-31 DIAGNOSIS — R2681 Unsteadiness on feet: Secondary | ICD-10-CM | POA: Diagnosis not present

## 2023-05-31 DIAGNOSIS — M19079 Primary osteoarthritis, unspecified ankle and foot: Secondary | ICD-10-CM | POA: Diagnosis not present

## 2023-05-31 DIAGNOSIS — R293 Abnormal posture: Secondary | ICD-10-CM | POA: Diagnosis not present

## 2023-05-31 DIAGNOSIS — M25672 Stiffness of left ankle, not elsewhere classified: Secondary | ICD-10-CM | POA: Diagnosis not present

## 2023-06-03 DIAGNOSIS — M25671 Stiffness of right ankle, not elsewhere classified: Secondary | ICD-10-CM | POA: Diagnosis not present

## 2023-06-03 DIAGNOSIS — M19079 Primary osteoarthritis, unspecified ankle and foot: Secondary | ICD-10-CM | POA: Diagnosis not present

## 2023-06-03 DIAGNOSIS — R2681 Unsteadiness on feet: Secondary | ICD-10-CM | POA: Diagnosis not present

## 2023-06-03 DIAGNOSIS — R293 Abnormal posture: Secondary | ICD-10-CM | POA: Diagnosis not present

## 2023-06-03 DIAGNOSIS — M25672 Stiffness of left ankle, not elsewhere classified: Secondary | ICD-10-CM | POA: Diagnosis not present

## 2023-06-03 DIAGNOSIS — M6281 Muscle weakness (generalized): Secondary | ICD-10-CM | POA: Diagnosis not present

## 2023-06-07 DIAGNOSIS — M25672 Stiffness of left ankle, not elsewhere classified: Secondary | ICD-10-CM | POA: Diagnosis not present

## 2023-06-07 DIAGNOSIS — M25671 Stiffness of right ankle, not elsewhere classified: Secondary | ICD-10-CM | POA: Diagnosis not present

## 2023-06-07 DIAGNOSIS — R293 Abnormal posture: Secondary | ICD-10-CM | POA: Diagnosis not present

## 2023-06-07 DIAGNOSIS — R2681 Unsteadiness on feet: Secondary | ICD-10-CM | POA: Diagnosis not present

## 2023-06-07 DIAGNOSIS — M19079 Primary osteoarthritis, unspecified ankle and foot: Secondary | ICD-10-CM | POA: Diagnosis not present

## 2023-06-07 DIAGNOSIS — M6281 Muscle weakness (generalized): Secondary | ICD-10-CM | POA: Diagnosis not present

## 2023-06-10 DIAGNOSIS — R2681 Unsteadiness on feet: Secondary | ICD-10-CM | POA: Diagnosis not present

## 2023-06-10 DIAGNOSIS — M19079 Primary osteoarthritis, unspecified ankle and foot: Secondary | ICD-10-CM | POA: Diagnosis not present

## 2023-06-10 DIAGNOSIS — M25672 Stiffness of left ankle, not elsewhere classified: Secondary | ICD-10-CM | POA: Diagnosis not present

## 2023-06-10 DIAGNOSIS — M25671 Stiffness of right ankle, not elsewhere classified: Secondary | ICD-10-CM | POA: Diagnosis not present

## 2023-06-10 DIAGNOSIS — R293 Abnormal posture: Secondary | ICD-10-CM | POA: Diagnosis not present

## 2023-06-10 DIAGNOSIS — M6281 Muscle weakness (generalized): Secondary | ICD-10-CM | POA: Diagnosis not present

## 2023-06-13 DIAGNOSIS — R0600 Dyspnea, unspecified: Secondary | ICD-10-CM | POA: Diagnosis not present

## 2023-06-13 DIAGNOSIS — G4733 Obstructive sleep apnea (adult) (pediatric): Secondary | ICD-10-CM | POA: Diagnosis not present

## 2023-06-13 DIAGNOSIS — R400 Somnolence: Secondary | ICD-10-CM | POA: Diagnosis not present

## 2023-06-13 DIAGNOSIS — D638 Anemia in other chronic diseases classified elsewhere: Secondary | ICD-10-CM | POA: Diagnosis not present

## 2023-06-19 DIAGNOSIS — M25672 Stiffness of left ankle, not elsewhere classified: Secondary | ICD-10-CM | POA: Diagnosis not present

## 2023-06-19 DIAGNOSIS — M19079 Primary osteoarthritis, unspecified ankle and foot: Secondary | ICD-10-CM | POA: Diagnosis not present

## 2023-06-19 DIAGNOSIS — R2681 Unsteadiness on feet: Secondary | ICD-10-CM | POA: Diagnosis not present

## 2023-06-19 DIAGNOSIS — R293 Abnormal posture: Secondary | ICD-10-CM | POA: Diagnosis not present

## 2023-06-19 DIAGNOSIS — M25671 Stiffness of right ankle, not elsewhere classified: Secondary | ICD-10-CM | POA: Diagnosis not present

## 2023-06-19 DIAGNOSIS — M6281 Muscle weakness (generalized): Secondary | ICD-10-CM | POA: Diagnosis not present

## 2023-06-24 DIAGNOSIS — M6289 Other specified disorders of muscle: Secondary | ICD-10-CM | POA: Diagnosis not present

## 2023-06-24 DIAGNOSIS — M62838 Other muscle spasm: Secondary | ICD-10-CM | POA: Diagnosis not present

## 2023-06-24 DIAGNOSIS — M6281 Muscle weakness (generalized): Secondary | ICD-10-CM | POA: Diagnosis not present

## 2023-06-26 DIAGNOSIS — G252 Other specified forms of tremor: Secondary | ICD-10-CM | POA: Diagnosis not present

## 2023-06-26 DIAGNOSIS — G20A1 Parkinson's disease without dyskinesia, without mention of fluctuations: Secondary | ICD-10-CM | POA: Diagnosis not present

## 2023-06-26 DIAGNOSIS — G243 Spasmodic torticollis: Secondary | ICD-10-CM | POA: Diagnosis not present

## 2023-06-26 DIAGNOSIS — G248 Other dystonia: Secondary | ICD-10-CM | POA: Diagnosis not present

## 2023-06-27 DIAGNOSIS — M25672 Stiffness of left ankle, not elsewhere classified: Secondary | ICD-10-CM | POA: Diagnosis not present

## 2023-06-27 DIAGNOSIS — M6281 Muscle weakness (generalized): Secondary | ICD-10-CM | POA: Diagnosis not present

## 2023-06-27 DIAGNOSIS — M19079 Primary osteoarthritis, unspecified ankle and foot: Secondary | ICD-10-CM | POA: Diagnosis not present

## 2023-06-27 DIAGNOSIS — R293 Abnormal posture: Secondary | ICD-10-CM | POA: Diagnosis not present

## 2023-06-27 DIAGNOSIS — R2681 Unsteadiness on feet: Secondary | ICD-10-CM | POA: Diagnosis not present

## 2023-06-27 DIAGNOSIS — M25671 Stiffness of right ankle, not elsewhere classified: Secondary | ICD-10-CM | POA: Diagnosis not present

## 2023-07-02 DIAGNOSIS — J4520 Mild intermittent asthma, uncomplicated: Secondary | ICD-10-CM | POA: Diagnosis not present

## 2023-07-03 DIAGNOSIS — R2681 Unsteadiness on feet: Secondary | ICD-10-CM | POA: Diagnosis not present

## 2023-07-03 DIAGNOSIS — M25671 Stiffness of right ankle, not elsewhere classified: Secondary | ICD-10-CM | POA: Diagnosis not present

## 2023-07-03 DIAGNOSIS — M25672 Stiffness of left ankle, not elsewhere classified: Secondary | ICD-10-CM | POA: Diagnosis not present

## 2023-07-03 DIAGNOSIS — M6281 Muscle weakness (generalized): Secondary | ICD-10-CM | POA: Diagnosis not present

## 2023-07-03 DIAGNOSIS — M19079 Primary osteoarthritis, unspecified ankle and foot: Secondary | ICD-10-CM | POA: Diagnosis not present

## 2023-07-03 DIAGNOSIS — R293 Abnormal posture: Secondary | ICD-10-CM | POA: Diagnosis not present

## 2023-07-09 DIAGNOSIS — M25672 Stiffness of left ankle, not elsewhere classified: Secondary | ICD-10-CM | POA: Diagnosis not present

## 2023-07-09 DIAGNOSIS — M6281 Muscle weakness (generalized): Secondary | ICD-10-CM | POA: Diagnosis not present

## 2023-07-09 DIAGNOSIS — M25671 Stiffness of right ankle, not elsewhere classified: Secondary | ICD-10-CM | POA: Diagnosis not present

## 2023-07-09 DIAGNOSIS — R293 Abnormal posture: Secondary | ICD-10-CM | POA: Diagnosis not present

## 2023-07-09 DIAGNOSIS — M19079 Primary osteoarthritis, unspecified ankle and foot: Secondary | ICD-10-CM | POA: Diagnosis not present

## 2023-07-09 DIAGNOSIS — R2681 Unsteadiness on feet: Secondary | ICD-10-CM | POA: Diagnosis not present

## 2023-07-17 DIAGNOSIS — M25671 Stiffness of right ankle, not elsewhere classified: Secondary | ICD-10-CM | POA: Diagnosis not present

## 2023-07-17 DIAGNOSIS — M19079 Primary osteoarthritis, unspecified ankle and foot: Secondary | ICD-10-CM | POA: Diagnosis not present

## 2023-07-17 DIAGNOSIS — M25672 Stiffness of left ankle, not elsewhere classified: Secondary | ICD-10-CM | POA: Diagnosis not present

## 2023-07-17 DIAGNOSIS — R293 Abnormal posture: Secondary | ICD-10-CM | POA: Diagnosis not present

## 2023-07-17 DIAGNOSIS — M21172 Varus deformity, not elsewhere classified, left ankle: Secondary | ICD-10-CM | POA: Diagnosis not present

## 2023-07-17 DIAGNOSIS — M6281 Muscle weakness (generalized): Secondary | ICD-10-CM | POA: Diagnosis not present

## 2023-07-17 DIAGNOSIS — R2681 Unsteadiness on feet: Secondary | ICD-10-CM | POA: Diagnosis not present

## 2023-07-24 DIAGNOSIS — M6281 Muscle weakness (generalized): Secondary | ICD-10-CM | POA: Diagnosis not present

## 2023-07-24 DIAGNOSIS — M25671 Stiffness of right ankle, not elsewhere classified: Secondary | ICD-10-CM | POA: Diagnosis not present

## 2023-07-24 DIAGNOSIS — M25672 Stiffness of left ankle, not elsewhere classified: Secondary | ICD-10-CM | POA: Diagnosis not present

## 2023-07-24 DIAGNOSIS — R2681 Unsteadiness on feet: Secondary | ICD-10-CM | POA: Diagnosis not present

## 2023-07-24 DIAGNOSIS — M19079 Primary osteoarthritis, unspecified ankle and foot: Secondary | ICD-10-CM | POA: Diagnosis not present

## 2023-07-24 DIAGNOSIS — R293 Abnormal posture: Secondary | ICD-10-CM | POA: Diagnosis not present

## 2023-07-31 DIAGNOSIS — M6281 Muscle weakness (generalized): Secondary | ICD-10-CM | POA: Diagnosis not present

## 2023-07-31 DIAGNOSIS — R293 Abnormal posture: Secondary | ICD-10-CM | POA: Diagnosis not present

## 2023-07-31 DIAGNOSIS — R2681 Unsteadiness on feet: Secondary | ICD-10-CM | POA: Diagnosis not present

## 2023-07-31 DIAGNOSIS — M25672 Stiffness of left ankle, not elsewhere classified: Secondary | ICD-10-CM | POA: Diagnosis not present

## 2023-07-31 DIAGNOSIS — M19079 Primary osteoarthritis, unspecified ankle and foot: Secondary | ICD-10-CM | POA: Diagnosis not present

## 2023-07-31 DIAGNOSIS — M25671 Stiffness of right ankle, not elsewhere classified: Secondary | ICD-10-CM | POA: Diagnosis not present

## 2023-08-06 DIAGNOSIS — G248 Other dystonia: Secondary | ICD-10-CM | POA: Diagnosis not present

## 2023-08-06 DIAGNOSIS — G243 Spasmodic torticollis: Secondary | ICD-10-CM | POA: Diagnosis not present

## 2023-08-07 DIAGNOSIS — R293 Abnormal posture: Secondary | ICD-10-CM | POA: Diagnosis not present

## 2023-08-07 DIAGNOSIS — M25671 Stiffness of right ankle, not elsewhere classified: Secondary | ICD-10-CM | POA: Diagnosis not present

## 2023-08-07 DIAGNOSIS — M25672 Stiffness of left ankle, not elsewhere classified: Secondary | ICD-10-CM | POA: Diagnosis not present

## 2023-08-07 DIAGNOSIS — R2681 Unsteadiness on feet: Secondary | ICD-10-CM | POA: Diagnosis not present

## 2023-08-07 DIAGNOSIS — M6281 Muscle weakness (generalized): Secondary | ICD-10-CM | POA: Diagnosis not present

## 2023-08-07 DIAGNOSIS — M19079 Primary osteoarthritis, unspecified ankle and foot: Secondary | ICD-10-CM | POA: Diagnosis not present

## 2023-08-21 DIAGNOSIS — G4710 Hypersomnia, unspecified: Secondary | ICD-10-CM | POA: Diagnosis not present

## 2023-08-21 DIAGNOSIS — G4733 Obstructive sleep apnea (adult) (pediatric): Secondary | ICD-10-CM | POA: Diagnosis not present

## 2023-08-29 ENCOUNTER — Other Ambulatory Visit: Payer: Self-pay | Admitting: Hematology and Oncology

## 2023-08-29 DIAGNOSIS — C50411 Malignant neoplasm of upper-outer quadrant of right female breast: Secondary | ICD-10-CM

## 2023-08-29 MED ORDER — ANASTROZOLE 1 MG PO TABS: 1.0000 mg | ORAL_TABLET | Freq: Every morning | ORAL | 3 refills | Status: DC

## 2023-09-12 DIAGNOSIS — R400 Somnolence: Secondary | ICD-10-CM | POA: Diagnosis not present

## 2023-09-12 DIAGNOSIS — R0600 Dyspnea, unspecified: Secondary | ICD-10-CM | POA: Diagnosis not present

## 2023-09-12 DIAGNOSIS — D638 Anemia in other chronic diseases classified elsewhere: Secondary | ICD-10-CM | POA: Diagnosis not present

## 2023-09-12 DIAGNOSIS — G4733 Obstructive sleep apnea (adult) (pediatric): Secondary | ICD-10-CM | POA: Diagnosis not present

## 2023-09-24 DIAGNOSIS — J4520 Mild intermittent asthma, uncomplicated: Secondary | ICD-10-CM | POA: Diagnosis not present

## 2023-09-30 ENCOUNTER — Ambulatory Visit: Payer: Medicare Other | Admitting: Oncology

## 2023-10-14 DIAGNOSIS — J4520 Mild intermittent asthma, uncomplicated: Secondary | ICD-10-CM | POA: Diagnosis not present

## 2023-10-14 DIAGNOSIS — J449 Chronic obstructive pulmonary disease, unspecified: Secondary | ICD-10-CM | POA: Diagnosis not present

## 2023-10-24 DIAGNOSIS — B351 Tinea unguium: Secondary | ICD-10-CM | POA: Diagnosis not present

## 2023-10-24 DIAGNOSIS — E119 Type 2 diabetes mellitus without complications: Secondary | ICD-10-CM | POA: Diagnosis not present

## 2023-10-24 DIAGNOSIS — Z7984 Long term (current) use of oral hypoglycemic drugs: Secondary | ICD-10-CM | POA: Diagnosis not present

## 2023-10-28 DIAGNOSIS — G243 Spasmodic torticollis: Secondary | ICD-10-CM | POA: Diagnosis not present

## 2023-10-28 DIAGNOSIS — G248 Other dystonia: Secondary | ICD-10-CM | POA: Diagnosis not present

## 2023-10-28 DIAGNOSIS — G249 Dystonia, unspecified: Secondary | ICD-10-CM | POA: Diagnosis not present

## 2023-11-05 NOTE — Progress Notes (Signed)
 [facility]  [address] [place],  [place]  [address] [phone]  Clinic Day:  11/07/2023  Referring physician: Ina Marcellus RAMAN, MD   CHIEF COMPLAINT:  CC: Stage IIA hormone receptor positive breast cancer  Current Treatment: Anastrozole  1 mg daily  HISTORY OF PRESENT ILLNESS:  Stephanie Anderson is a 73 y.o. female with a history of stage IIA (T2 N0 M0) hormone receptor positive invasive ductal carcinoma in the upper outer quadrant of the right breast in July 2017. This was treated with lumpectomy followed by adjuvant radiation.  She was placed on endocrine therapy with anastrozole  1 mg daily in September 2017. We began seeing her in September 2021 when she transferred her care after moving from [place]  in October 2020. She underwent menarche around age 56. She is nulliparous. She went through menopause in her 80's. She never took hormone replacement therapy.  Bilateral diagnostic mammogram in October 2020 did not reveal any evidence of malignancy.  Bone density from October 2020 revealed osteopenia with a T-score of -1.6 in the left femur neck, for which she is on calcium/vitamin D ..  She was seen in the emergency room in August 2021 with nausea, vomiting and abdominal pain.  CT abdomen and pelvis did not reveal any acute intra-abdominal or pelvic pathology.  Diverticulosis without diverticulitis, as well as aortic atherosclerosis was seen.  The patient is on rosuvastatin .  Bilateral diagnostic mammogram in November 2021 revealed stable lumpectomy changes without evidence of malignancy.  Bone density scan in October 2022 revealed mild worsening osteopenia with a T-score of -1.7 in the left femur neck, previously -1.6, and a T-score of -1.7 in the left forearm, previously -0.4.  She continued calcium/vitamin D . She underwent EGD and colonoscopy with Dr. Larene in October 2022. Colonoscopy revealed two polyps and pathology with a tubular adenoma of the sigmoid  colon.  EGD revealed a hiatal hernia, but was otherwise normal.  Bilateral diagnostic mammogram in November 2022 revealed stable lumpectomy changes.  Bilateral screening mammogram in 1 year was recommended.   She completed 5 years of anastrozole  in September 2023.  She desired to continue continue this and due to her stage, we did agree to 10 years of adjuvant endocrine therapy as long as she did not have further worsening of her osteopenia.  Bilateral screening mammogram in January 2024 did not reveal any evidence of malignancy.   Oncology History  Breast cancer in female [facility])  05/07/2016 Initial Diagnosis   Breast cancer in female [facility])   05/07/2016 Cancer Staging   Staging form: Breast, AJCC 7th Edition - Clinical stage from 05/07/2016: Stage IIA (T2, N0, M0) - Signed by Cornelius Wanda DEL, MD on 12/08/2020 Staged by: Managing physician Diagnostic confirmation: Positive histology Specimen type: Excision Histopathologic type: Infiltrating duct carcinoma, NOS Stage prefix: Initial diagnosis Laterality: Right Histologic grade (G): G2 Lymph-vascular invasion (LVI): LVI not present (absent)/not identified Residual tumor (R): R0 - None Paget's disease: Negative Estrogen receptor status: Positive Progesterone receptor status: Positive HER2 status: Negative Multi-gene signature risk of recurrence: Not assessed Stage used in treatment planning: Yes National guidelines used in treatment planning: Yes Type of national guideline used in treatment planning: NCCN       INTERVAL HISTORY:  [name] is here today for repeat clinical assessment.  She states she continues anastrozole  daily without difficulty.  She denies any changes in her breasts.  She reports shortness of breath with exertion.  She denies chest pain or palpitations.  She reports difficulty  swallowing liquids.  She does have a known hiatal hernia.  She has persistent tremors from Parkinson's.  She has left foot drop and is a brace.   She is now ambulating with a walker.  She denies fevers or chills. She denies pain. Her appetite is good. Her weight has decreased 2 pounds over last 13 months . She is overdue for bone density scan. She is due for bilateral screening mammogram later this month.  She continues medications for Parkinson's and has had a recent Botox injection for cervical dystonia.  REVIEW OF SYSTEMS:  Review of Systems  Constitutional:  Negative for appetite change, chills, diaphoresis, fatigue, fever and unexpected weight change.  HENT:   Positive for trouble swallowing (liquids). Negative for lump/mass, mouth sores and sore throat.   Respiratory:  Positive for shortness of breath (with exertion). Negative for cough and hemoptysis.   Cardiovascular:  Negative for chest pain and leg swelling.  Gastrointestinal:  Negative for abdominal pain, blood in stool, constipation, diarrhea, nausea and vomiting.  Endocrine: Negative for hot flashes.  Genitourinary:  Negative for difficulty urinating, dysuria, frequency, hematuria and vaginal bleeding.   Musculoskeletal:  Positive for gait problem. Negative for arthralgias, back pain and myalgias.  Skin:  Negative for rash.  Neurological:  Positive for extremity weakness and gait problem. Negative for dizziness, headaches and light-headedness.  Hematological:  Negative for adenopathy. Does not bruise/bleed easily.  Psychiatric/Behavioral:  Negative for depression and sleep disturbance. The patient is not nervous/anxious.      VITALS:  Blood pressure 137/79, pulse 79, temperature 98.3 F (36.8 C), temperature source Oral, resp. rate 18, height 5' 6 (1.676 m), weight 255 lb 6.4 oz (115.8 kg), SpO2 97%.  Wt Readings from Last 3 Encounters:  11/07/23 255 lb 6.4 oz (115.8 kg)  09/28/22 257 lb 14.4 oz (117 kg)  09/28/21 249 lb 12.8 oz (113.3 kg)    Body mass index is 41.22 kg/m.  Performance status (ECOG): 2 - Symptomatic, <50% confined to bed  PHYSICAL EXAM:  Physical  Exam Vitals and nursing note reviewed.  Constitutional:      General: She is not in acute distress.    Appearance: Normal appearance. She is obese. She is ill-appearing (chronically ill appearing). She is not toxic-appearing or diaphoretic.  HENT:     Head: Normocephalic and atraumatic.     Mouth/Throat:     Mouth: Mucous membranes are moist.     Pharynx: Oropharynx is clear. No oropharyngeal exudate or posterior oropharyngeal erythema.  Eyes:     General: No scleral icterus.    Extraocular Movements: Extraocular movements intact.     Conjunctiva/sclera: Conjunctivae normal.     Pupils: Pupils are equal, round, and reactive to light.  Cardiovascular:     Rate and Rhythm: Normal rate and regular rhythm.     Heart sounds: Normal heart sounds. No murmur heard.    No friction rub. No gallop.  Pulmonary:     Effort: Pulmonary effort is normal.     Breath sounds: Normal breath sounds. No wheezing, rhonchi or rales.  Chest:  Breasts:    Right: Normal. No swelling, bleeding, inverted nipple, mass, nipple discharge, skin change or tenderness.     Left: Normal. No swelling, bleeding, inverted nipple, mass, nipple discharge, skin change or tenderness.     Comments: Stable lumpectomy changes in the right breast Abdominal:     General: There is no distension.     Palpations: Abdomen is soft. There is no hepatomegaly, splenomegaly  or mass.     Tenderness: There is no abdominal tenderness.  Musculoskeletal:        General: Normal range of motion.     Cervical back: Normal range of motion and neck supple. No tenderness.     Right lower leg: No edema.     Left lower leg: No edema.  Lymphadenopathy:     Cervical: No cervical adenopathy.     Upper Body:     Right upper body: No supraclavicular or axillary adenopathy.     Left upper body: No supraclavicular or axillary adenopathy.  Skin:    General: Skin is warm and dry.     Coloration: Skin is not jaundiced.     Findings: No rash.   Neurological:     Mental Status: She is alert and oriented to person, place, and time.     Cranial Nerves: No cranial nerve deficit.     Motor: Weakness (left foot drop) and tremor (bilateral arms) present.     Gait: Gait abnormal (ambulates with a walker).  Psychiatric:        Mood and Affect: Mood normal.        Behavior: Behavior normal.        Thought Content: Thought content normal.    LABS:       No data to display             No data to display          STUDIES:  No results found.    HISTORY:   Past Medical History:  Diagnosis Date   Asthma    breast cancer    Depression    Diabetes mellitus without complication (HCC)    H/O: hysterectomy    Hypertension    Parkinson's disease (HCC)    Peripheral edema    Sleep apnea     Past Surgical History:  Procedure Laterality Date   BREAST LUMPECTOMY Right    left knee replacement     TONSILLECTOMY      Family History  Problem Relation Age of Onset   Dementia Mother    Parkinson's disease Father    Hypertension Father    Edema Father    Scoliosis Sister    Tremor Sister        essential    Diabetes Sister        boaderline   Hypertension Sister     Social History:  reports that she has never smoked. She has never used smokeless tobacco. She reports that she does not drink alcohol and does not use drugs.The patient is accompanied by her sister today.  Allergies:  Allergies  Allergen Reactions   Furosemide Other (See Comments)    Abdominal pain  Abdominal pain    Abdominal pain Abdominal pain   Rasagiline Nausea And Vomiting and Other (See Comments)    Current Medications: Current Outpatient Medications  Medication Sig Dispense Refill   calcium carbonate (OSCAL) 1500 (600 Ca) MG TABS tablet Take by mouth 2 (two) times daily with a meal.     ferrous sulfate 324 MG TBEC Take 324 mg by mouth daily with breakfast.     GEMTESA 75 MG TABS Take 1 tablet by mouth daily.     lisinopril  (ZESTRIL) 20 MG tablet Take 20 mg by mouth daily.     albuterol  (PROVENTIL ) (2.5 MG/3ML) 0.083% nebulizer solution Take 2.5 mg by nebulization 4 (four) times daily.     anastrozole  (ARIMIDEX ) 1 MG tablet Take  1 tablet (1 mg total) by mouth every morning. 90 tablet 3   atenolol (TENORMIN) 25 MG tablet Take by mouth daily. 1 tab every morning (Patient not taking: Reported on 11/07/2023)     budesonide  (PULMICORT ) 0.5 MG/2ML nebulizer solution SMARTSIG:1 Vial(s) Via Nebulizer Every 12 Hours     carbidopa-levodopa (SINEMET IR) 25-100 MG tablet Take 1 tablet by mouth 3 (three) times daily.     hydrochlorothiazide (HYDRODIURIL) 50 MG tablet Take 50 mg by mouth daily. 1 tablet every morning     losartan  (COZAAR ) 100 MG tablet Take 100 mg by mouth daily.     LUMIGAN  0.01 % SOLN 1 drop daily.     OZEMPIC, 0.25 OR 0.5 MG/DOSE, 2 MG/3ML SOPN SMARTSIG:0.25 Milligram(s) SUB-Q Once a Week     potassium chloride  (KLOR-CON ) 10 MEQ tablet Take 10 mEq by mouth daily.     rosuvastatin  (CRESTOR ) 20 MG tablet Take 20 mg by mouth at bedtime.     Vitamin D , Ergocalciferol , (DRISDOL) 1.25 MG (50000 UNIT) CAPS capsule Take 50,000 Units by mouth once a week. Taking once every 2 weeks     No current facility-administered medications for this visit.     ASSESSMENT & PLAN:   Assessment & Plan: ETHELL BLATCHFORD is a 73 y.o. female with   Stage IIA hormone receptor positive breast cancer.  She remains without evidence of recurrence.  She continues anastrozole  daily without difficulty.  We have planned for extended adjuvant hormonal therapy depending on her bone density.  She is overdue for bone density scan, so I will schedule that along with her mammogram later this month. For now, she will continue anastrozole  daily.  Osteopenia, she continues calcium 600 mg twice daily and vitamin D  50,000 units every other week.   I will plan to see her back in 1 year with a bilateral screening mammogram. The patient understands the  plans discussed today and is in agreement with them.  She knows to contact our office if she develops concerns prior to her next appointment.     I provided 40 minutes of face-to-face time during this encounter and > 50% was spent counseling as documented under my assessment and plan.    Sonu Kruckenberg A Yarah Fuente, PA-C  [facility] - A DEPT OF [facility] [address] [place] [address] Dept: [phone] Dept Fax: [phone]   Orders Placed This Encounter  Procedures   MM DIGITAL SCREENING BILATERAL    Standing Status:   Future    Expected Date:   11/18/2023    Expiration Date:   11/06/2024    Reason for exam::   screening for breast cancer, h/o right breast cancer    Preferred imaging location?:   [facility]   DG Bone Density    Standing Status:   Future    Expected Date:   11/18/2023    Expiration Date:   11/06/2024    Reason for Exam (SYMPTOM  OR DIAGNOSIS REQUIRED):   osteopenia    Preferred imaging location?:   [facility]

## 2023-11-07 ENCOUNTER — Encounter: Payer: Self-pay | Admitting: Hematology and Oncology

## 2023-11-07 ENCOUNTER — Inpatient Hospital Stay: Payer: Medicare Other | Attending: Hematology and Oncology | Admitting: Hematology and Oncology

## 2023-11-07 VITALS — BP 137/79 | HR 79 | Temp 98.3°F | Resp 18 | Ht 66.0 in | Wt 255.4 lb

## 2023-11-07 DIAGNOSIS — M85852 Other specified disorders of bone density and structure, left thigh: Secondary | ICD-10-CM | POA: Insufficient documentation

## 2023-11-07 DIAGNOSIS — Z79811 Long term (current) use of aromatase inhibitors: Secondary | ICD-10-CM | POA: Diagnosis not present

## 2023-11-07 DIAGNOSIS — Z1231 Encounter for screening mammogram for malignant neoplasm of breast: Secondary | ICD-10-CM | POA: Diagnosis not present

## 2023-11-07 DIAGNOSIS — Z170 Estrogen receptor positive status [ER+]: Secondary | ICD-10-CM | POA: Insufficient documentation

## 2023-11-07 DIAGNOSIS — M8580 Other specified disorders of bone density and structure, unspecified site: Secondary | ICD-10-CM

## 2023-11-07 DIAGNOSIS — Z780 Asymptomatic menopausal state: Secondary | ICD-10-CM

## 2023-11-07 DIAGNOSIS — C50411 Malignant neoplasm of upper-outer quadrant of right female breast: Secondary | ICD-10-CM | POA: Diagnosis present

## 2023-11-08 ENCOUNTER — Encounter: Payer: Self-pay | Admitting: Hematology and Oncology

## 2023-11-11 ENCOUNTER — Encounter: Payer: Self-pay | Admitting: Hematology and Oncology

## 2023-11-18 ENCOUNTER — Ambulatory Visit (HOSPITAL_BASED_OUTPATIENT_CLINIC_OR_DEPARTMENT_OTHER)
Admission: RE | Admit: 2023-11-18 | Discharge: 2023-11-18 | Disposition: A | Discharge status: Home / Self Care | Payer: Medicare Other | Source: Ambulatory Visit | Attending: Hematology and Oncology | Admitting: Hematology and Oncology

## 2023-11-18 ENCOUNTER — Encounter (HOSPITAL_BASED_OUTPATIENT_CLINIC_OR_DEPARTMENT_OTHER): Payer: Self-pay | Admitting: Radiology

## 2023-11-18 DIAGNOSIS — Z1231 Encounter for screening mammogram for malignant neoplasm of breast: Secondary | ICD-10-CM | POA: Diagnosis not present

## 2023-11-18 DIAGNOSIS — Z79811 Long term (current) use of aromatase inhibitors: Secondary | ICD-10-CM

## 2023-11-18 DIAGNOSIS — Z780 Asymptomatic menopausal state: Secondary | ICD-10-CM | POA: Diagnosis not present

## 2023-11-18 DIAGNOSIS — Z170 Estrogen receptor positive status [ER+]: Secondary | ICD-10-CM

## 2023-11-18 DIAGNOSIS — M8580 Other specified disorders of bone density and structure, unspecified site: Secondary | ICD-10-CM

## 2023-11-19 ENCOUNTER — Other Ambulatory Visit (HOSPITAL_BASED_OUTPATIENT_CLINIC_OR_DEPARTMENT_OTHER): Payer: Medicare Other | Admitting: Radiology

## 2023-11-19 ENCOUNTER — Inpatient Hospital Stay (HOSPITAL_BASED_OUTPATIENT_CLINIC_OR_DEPARTMENT_OTHER): Admission: RE | Admit: 2023-11-19 | Payer: Medicare Other | Source: Ambulatory Visit | Admitting: Radiology

## 2023-11-29 ENCOUNTER — Other Ambulatory Visit: Payer: Self-pay | Admitting: Hematology and Oncology

## 2023-11-29 DIAGNOSIS — Z170 Estrogen receptor positive status [ER+]: Secondary | ICD-10-CM

## 2023-11-29 DIAGNOSIS — Z1231 Encounter for screening mammogram for malignant neoplasm of breast: Secondary | ICD-10-CM

## 2023-12-02 ENCOUNTER — Telehealth: Payer: Self-pay

## 2023-12-02 NOTE — Telephone Encounter (Signed)
-----   Message from Adah Perl sent at 12/02/2023  4:06 PM EST ----- Please let her know her mammo was negative, waiting on bone density scan results. Thanks

## 2023-12-02 NOTE — Telephone Encounter (Signed)
Patients sister Tobi Bastos notified and voiced understanding she will notify patient.

## 2023-12-11 DIAGNOSIS — H401131 Primary open-angle glaucoma, bilateral, mild stage: Secondary | ICD-10-CM | POA: Diagnosis not present

## 2023-12-17 ENCOUNTER — Telehealth: Payer: Self-pay

## 2023-12-17 ENCOUNTER — Other Ambulatory Visit: Payer: Self-pay | Admitting: Hematology and Oncology

## 2023-12-17 DIAGNOSIS — C50411 Malignant neoplasm of upper-outer quadrant of right female breast: Secondary | ICD-10-CM

## 2023-12-17 DIAGNOSIS — Z1231 Encounter for screening mammogram for malignant neoplasm of breast: Secondary | ICD-10-CM

## 2023-12-17 NOTE — Telephone Encounter (Signed)
-----   Message from Adah Perl sent at 12/17/2023  2:27 PM EST ----- Please let her know her bone density has improved in the wrist and is stable in the hip. Continue calcium and vitamin D. We can see her back next January after her mammogram. Scheduling will call her. Thanks

## 2023-12-17 NOTE — Telephone Encounter (Signed)
 Detailed message left for scheduling.

## 2024-01-02 DIAGNOSIS — R0600 Dyspnea, unspecified: Secondary | ICD-10-CM | POA: Diagnosis not present

## 2024-01-02 DIAGNOSIS — R400 Somnolence: Secondary | ICD-10-CM | POA: Diagnosis not present

## 2024-01-02 DIAGNOSIS — D638 Anemia in other chronic diseases classified elsewhere: Secondary | ICD-10-CM | POA: Diagnosis not present

## 2024-01-02 DIAGNOSIS — G4733 Obstructive sleep apnea (adult) (pediatric): Secondary | ICD-10-CM | POA: Diagnosis not present

## 2024-01-07 DIAGNOSIS — J4520 Mild intermittent asthma, uncomplicated: Secondary | ICD-10-CM | POA: Diagnosis not present

## 2024-01-13 DIAGNOSIS — E118 Type 2 diabetes mellitus with unspecified complications: Secondary | ICD-10-CM | POA: Diagnosis not present

## 2024-01-20 DIAGNOSIS — N3281 Overactive bladder: Secondary | ICD-10-CM | POA: Diagnosis not present

## 2024-01-21 DIAGNOSIS — G243 Spasmodic torticollis: Secondary | ICD-10-CM | POA: Diagnosis not present

## 2024-01-21 DIAGNOSIS — G248 Other dystonia: Secondary | ICD-10-CM | POA: Diagnosis not present

## 2024-02-06 DIAGNOSIS — J4520 Mild intermittent asthma, uncomplicated: Secondary | ICD-10-CM | POA: Diagnosis not present

## 2024-02-27 DIAGNOSIS — E119 Type 2 diabetes mellitus without complications: Secondary | ICD-10-CM | POA: Diagnosis not present

## 2024-02-27 DIAGNOSIS — B351 Tinea unguium: Secondary | ICD-10-CM | POA: Diagnosis not present

## 2024-04-14 DIAGNOSIS — G4710 Hypersomnia, unspecified: Secondary | ICD-10-CM | POA: Diagnosis not present

## 2024-04-14 DIAGNOSIS — G252 Other specified forms of tremor: Secondary | ICD-10-CM | POA: Diagnosis not present

## 2024-04-14 DIAGNOSIS — G20C Parkinsonism, unspecified: Secondary | ICD-10-CM | POA: Diagnosis not present

## 2024-04-14 DIAGNOSIS — G248 Other dystonia: Secondary | ICD-10-CM | POA: Diagnosis not present

## 2024-04-14 DIAGNOSIS — G243 Spasmodic torticollis: Secondary | ICD-10-CM | POA: Diagnosis not present

## 2024-04-27 DIAGNOSIS — J4520 Mild intermittent asthma, uncomplicated: Secondary | ICD-10-CM | POA: Diagnosis not present

## 2024-05-08 DIAGNOSIS — G248 Other dystonia: Secondary | ICD-10-CM | POA: Diagnosis not present

## 2024-05-08 DIAGNOSIS — Z556 Problems related to health literacy: Secondary | ICD-10-CM | POA: Diagnosis not present

## 2024-05-08 DIAGNOSIS — E119 Type 2 diabetes mellitus without complications: Secondary | ICD-10-CM | POA: Diagnosis not present

## 2024-05-08 DIAGNOSIS — G4710 Hypersomnia, unspecified: Secondary | ICD-10-CM | POA: Diagnosis not present

## 2024-05-08 DIAGNOSIS — Z79811 Long term (current) use of aromatase inhibitors: Secondary | ICD-10-CM | POA: Diagnosis not present

## 2024-05-08 DIAGNOSIS — M1990 Unspecified osteoarthritis, unspecified site: Secondary | ICD-10-CM | POA: Diagnosis not present

## 2024-05-08 DIAGNOSIS — G4733 Obstructive sleep apnea (adult) (pediatric): Secondary | ICD-10-CM | POA: Diagnosis not present

## 2024-05-08 DIAGNOSIS — I10 Essential (primary) hypertension: Secondary | ICD-10-CM | POA: Diagnosis not present

## 2024-05-08 DIAGNOSIS — J45909 Unspecified asthma, uncomplicated: Secondary | ICD-10-CM | POA: Diagnosis not present

## 2024-05-08 DIAGNOSIS — Z7951 Long term (current) use of inhaled steroids: Secondary | ICD-10-CM | POA: Diagnosis not present

## 2024-05-08 DIAGNOSIS — Z7985 Long-term (current) use of injectable non-insulin antidiabetic drugs: Secondary | ICD-10-CM | POA: Diagnosis not present

## 2024-05-08 DIAGNOSIS — G20A1 Parkinson's disease without dyskinesia, without mention of fluctuations: Secondary | ICD-10-CM | POA: Diagnosis not present

## 2024-05-08 DIAGNOSIS — G252 Other specified forms of tremor: Secondary | ICD-10-CM | POA: Diagnosis not present

## 2024-05-08 DIAGNOSIS — C50919 Malignant neoplasm of unspecified site of unspecified female breast: Secondary | ICD-10-CM | POA: Diagnosis not present

## 2024-05-12 DIAGNOSIS — Z7951 Long term (current) use of inhaled steroids: Secondary | ICD-10-CM | POA: Diagnosis not present

## 2024-05-12 DIAGNOSIS — E119 Type 2 diabetes mellitus without complications: Secondary | ICD-10-CM | POA: Diagnosis not present

## 2024-05-12 DIAGNOSIS — G4733 Obstructive sleep apnea (adult) (pediatric): Secondary | ICD-10-CM | POA: Diagnosis not present

## 2024-05-12 DIAGNOSIS — Z7985 Long-term (current) use of injectable non-insulin antidiabetic drugs: Secondary | ICD-10-CM | POA: Diagnosis not present

## 2024-05-12 DIAGNOSIS — G4710 Hypersomnia, unspecified: Secondary | ICD-10-CM | POA: Diagnosis not present

## 2024-05-12 DIAGNOSIS — J45909 Unspecified asthma, uncomplicated: Secondary | ICD-10-CM | POA: Diagnosis not present

## 2024-05-12 DIAGNOSIS — G248 Other dystonia: Secondary | ICD-10-CM | POA: Diagnosis not present

## 2024-05-12 DIAGNOSIS — M1990 Unspecified osteoarthritis, unspecified site: Secondary | ICD-10-CM | POA: Diagnosis not present

## 2024-05-12 DIAGNOSIS — G20A1 Parkinson's disease without dyskinesia, without mention of fluctuations: Secondary | ICD-10-CM | POA: Diagnosis not present

## 2024-05-12 DIAGNOSIS — C50919 Malignant neoplasm of unspecified site of unspecified female breast: Secondary | ICD-10-CM | POA: Diagnosis not present

## 2024-05-12 DIAGNOSIS — Z556 Problems related to health literacy: Secondary | ICD-10-CM | POA: Diagnosis not present

## 2024-05-12 DIAGNOSIS — Z79811 Long term (current) use of aromatase inhibitors: Secondary | ICD-10-CM | POA: Diagnosis not present

## 2024-05-12 DIAGNOSIS — G252 Other specified forms of tremor: Secondary | ICD-10-CM | POA: Diagnosis not present

## 2024-05-12 DIAGNOSIS — I10 Essential (primary) hypertension: Secondary | ICD-10-CM | POA: Diagnosis not present

## 2024-05-14 DIAGNOSIS — G4733 Obstructive sleep apnea (adult) (pediatric): Secondary | ICD-10-CM | POA: Diagnosis not present

## 2024-05-14 DIAGNOSIS — I10 Essential (primary) hypertension: Secondary | ICD-10-CM | POA: Diagnosis not present

## 2024-05-14 DIAGNOSIS — Z556 Problems related to health literacy: Secondary | ICD-10-CM | POA: Diagnosis not present

## 2024-05-14 DIAGNOSIS — G20A1 Parkinson's disease without dyskinesia, without mention of fluctuations: Secondary | ICD-10-CM | POA: Diagnosis not present

## 2024-05-14 DIAGNOSIS — G4710 Hypersomnia, unspecified: Secondary | ICD-10-CM | POA: Diagnosis not present

## 2024-05-14 DIAGNOSIS — Z79811 Long term (current) use of aromatase inhibitors: Secondary | ICD-10-CM | POA: Diagnosis not present

## 2024-05-14 DIAGNOSIS — M1990 Unspecified osteoarthritis, unspecified site: Secondary | ICD-10-CM | POA: Diagnosis not present

## 2024-05-14 DIAGNOSIS — J45909 Unspecified asthma, uncomplicated: Secondary | ICD-10-CM | POA: Diagnosis not present

## 2024-05-14 DIAGNOSIS — G252 Other specified forms of tremor: Secondary | ICD-10-CM | POA: Diagnosis not present

## 2024-05-14 DIAGNOSIS — Z7985 Long-term (current) use of injectable non-insulin antidiabetic drugs: Secondary | ICD-10-CM | POA: Diagnosis not present

## 2024-05-14 DIAGNOSIS — C50919 Malignant neoplasm of unspecified site of unspecified female breast: Secondary | ICD-10-CM | POA: Diagnosis not present

## 2024-05-14 DIAGNOSIS — E119 Type 2 diabetes mellitus without complications: Secondary | ICD-10-CM | POA: Diagnosis not present

## 2024-05-14 DIAGNOSIS — G248 Other dystonia: Secondary | ICD-10-CM | POA: Diagnosis not present

## 2024-05-14 DIAGNOSIS — Z7951 Long term (current) use of inhaled steroids: Secondary | ICD-10-CM | POA: Diagnosis not present

## 2024-05-18 DIAGNOSIS — G252 Other specified forms of tremor: Secondary | ICD-10-CM | POA: Diagnosis not present

## 2024-05-18 DIAGNOSIS — G248 Other dystonia: Secondary | ICD-10-CM | POA: Diagnosis not present

## 2024-05-18 DIAGNOSIS — G20A1 Parkinson's disease without dyskinesia, without mention of fluctuations: Secondary | ICD-10-CM | POA: Diagnosis not present

## 2024-05-19 DIAGNOSIS — J45909 Unspecified asthma, uncomplicated: Secondary | ICD-10-CM | POA: Diagnosis not present

## 2024-05-19 DIAGNOSIS — I10 Essential (primary) hypertension: Secondary | ICD-10-CM | POA: Diagnosis not present

## 2024-05-19 DIAGNOSIS — Z7951 Long term (current) use of inhaled steroids: Secondary | ICD-10-CM | POA: Diagnosis not present

## 2024-05-19 DIAGNOSIS — G252 Other specified forms of tremor: Secondary | ICD-10-CM | POA: Diagnosis not present

## 2024-05-19 DIAGNOSIS — G4733 Obstructive sleep apnea (adult) (pediatric): Secondary | ICD-10-CM | POA: Diagnosis not present

## 2024-05-19 DIAGNOSIS — Z7985 Long-term (current) use of injectable non-insulin antidiabetic drugs: Secondary | ICD-10-CM | POA: Diagnosis not present

## 2024-05-19 DIAGNOSIS — G20A1 Parkinson's disease without dyskinesia, without mention of fluctuations: Secondary | ICD-10-CM | POA: Diagnosis not present

## 2024-05-19 DIAGNOSIS — C50919 Malignant neoplasm of unspecified site of unspecified female breast: Secondary | ICD-10-CM | POA: Diagnosis not present

## 2024-05-19 DIAGNOSIS — G4710 Hypersomnia, unspecified: Secondary | ICD-10-CM | POA: Diagnosis not present

## 2024-05-19 DIAGNOSIS — Z79811 Long term (current) use of aromatase inhibitors: Secondary | ICD-10-CM | POA: Diagnosis not present

## 2024-05-19 DIAGNOSIS — G248 Other dystonia: Secondary | ICD-10-CM | POA: Diagnosis not present

## 2024-05-19 DIAGNOSIS — M1990 Unspecified osteoarthritis, unspecified site: Secondary | ICD-10-CM | POA: Diagnosis not present

## 2024-05-19 DIAGNOSIS — E119 Type 2 diabetes mellitus without complications: Secondary | ICD-10-CM | POA: Diagnosis not present

## 2024-05-19 DIAGNOSIS — Z556 Problems related to health literacy: Secondary | ICD-10-CM | POA: Diagnosis not present

## 2024-05-21 DIAGNOSIS — G252 Other specified forms of tremor: Secondary | ICD-10-CM | POA: Diagnosis not present

## 2024-05-21 DIAGNOSIS — G20A1 Parkinson's disease without dyskinesia, without mention of fluctuations: Secondary | ICD-10-CM | POA: Diagnosis not present

## 2024-05-21 DIAGNOSIS — E119 Type 2 diabetes mellitus without complications: Secondary | ICD-10-CM | POA: Diagnosis not present

## 2024-05-21 DIAGNOSIS — Z556 Problems related to health literacy: Secondary | ICD-10-CM | POA: Diagnosis not present

## 2024-05-21 DIAGNOSIS — Z7951 Long term (current) use of inhaled steroids: Secondary | ICD-10-CM | POA: Diagnosis not present

## 2024-05-21 DIAGNOSIS — Z7985 Long-term (current) use of injectable non-insulin antidiabetic drugs: Secondary | ICD-10-CM | POA: Diagnosis not present

## 2024-05-21 DIAGNOSIS — J45909 Unspecified asthma, uncomplicated: Secondary | ICD-10-CM | POA: Diagnosis not present

## 2024-05-21 DIAGNOSIS — M1990 Unspecified osteoarthritis, unspecified site: Secondary | ICD-10-CM | POA: Diagnosis not present

## 2024-05-21 DIAGNOSIS — G248 Other dystonia: Secondary | ICD-10-CM | POA: Diagnosis not present

## 2024-05-21 DIAGNOSIS — C50919 Malignant neoplasm of unspecified site of unspecified female breast: Secondary | ICD-10-CM | POA: Diagnosis not present

## 2024-05-21 DIAGNOSIS — G4733 Obstructive sleep apnea (adult) (pediatric): Secondary | ICD-10-CM | POA: Diagnosis not present

## 2024-05-21 DIAGNOSIS — G4710 Hypersomnia, unspecified: Secondary | ICD-10-CM | POA: Diagnosis not present

## 2024-05-21 DIAGNOSIS — I10 Essential (primary) hypertension: Secondary | ICD-10-CM | POA: Diagnosis not present

## 2024-05-21 DIAGNOSIS — Z79811 Long term (current) use of aromatase inhibitors: Secondary | ICD-10-CM | POA: Diagnosis not present

## 2024-05-26 DIAGNOSIS — I10 Essential (primary) hypertension: Secondary | ICD-10-CM | POA: Diagnosis not present

## 2024-05-26 DIAGNOSIS — C50919 Malignant neoplasm of unspecified site of unspecified female breast: Secondary | ICD-10-CM | POA: Diagnosis not present

## 2024-05-26 DIAGNOSIS — M1990 Unspecified osteoarthritis, unspecified site: Secondary | ICD-10-CM | POA: Diagnosis not present

## 2024-05-26 DIAGNOSIS — Z79811 Long term (current) use of aromatase inhibitors: Secondary | ICD-10-CM | POA: Diagnosis not present

## 2024-05-26 DIAGNOSIS — J45909 Unspecified asthma, uncomplicated: Secondary | ICD-10-CM | POA: Diagnosis not present

## 2024-05-26 DIAGNOSIS — G252 Other specified forms of tremor: Secondary | ICD-10-CM | POA: Diagnosis not present

## 2024-05-26 DIAGNOSIS — Z7985 Long-term (current) use of injectable non-insulin antidiabetic drugs: Secondary | ICD-10-CM | POA: Diagnosis not present

## 2024-05-26 DIAGNOSIS — Z7951 Long term (current) use of inhaled steroids: Secondary | ICD-10-CM | POA: Diagnosis not present

## 2024-05-26 DIAGNOSIS — G20A1 Parkinson's disease without dyskinesia, without mention of fluctuations: Secondary | ICD-10-CM | POA: Diagnosis not present

## 2024-05-26 DIAGNOSIS — G4710 Hypersomnia, unspecified: Secondary | ICD-10-CM | POA: Diagnosis not present

## 2024-05-26 DIAGNOSIS — G248 Other dystonia: Secondary | ICD-10-CM | POA: Diagnosis not present

## 2024-05-26 DIAGNOSIS — E119 Type 2 diabetes mellitus without complications: Secondary | ICD-10-CM | POA: Diagnosis not present

## 2024-05-26 DIAGNOSIS — Z556 Problems related to health literacy: Secondary | ICD-10-CM | POA: Diagnosis not present

## 2024-05-26 DIAGNOSIS — G4733 Obstructive sleep apnea (adult) (pediatric): Secondary | ICD-10-CM | POA: Diagnosis not present

## 2024-05-28 DIAGNOSIS — G4733 Obstructive sleep apnea (adult) (pediatric): Secondary | ICD-10-CM | POA: Diagnosis not present

## 2024-05-28 DIAGNOSIS — M1990 Unspecified osteoarthritis, unspecified site: Secondary | ICD-10-CM | POA: Diagnosis not present

## 2024-05-28 DIAGNOSIS — J45909 Unspecified asthma, uncomplicated: Secondary | ICD-10-CM | POA: Diagnosis not present

## 2024-05-28 DIAGNOSIS — G248 Other dystonia: Secondary | ICD-10-CM | POA: Diagnosis not present

## 2024-05-28 DIAGNOSIS — G20A1 Parkinson's disease without dyskinesia, without mention of fluctuations: Secondary | ICD-10-CM | POA: Diagnosis not present

## 2024-05-28 DIAGNOSIS — C50919 Malignant neoplasm of unspecified site of unspecified female breast: Secondary | ICD-10-CM | POA: Diagnosis not present

## 2024-05-28 DIAGNOSIS — Z7951 Long term (current) use of inhaled steroids: Secondary | ICD-10-CM | POA: Diagnosis not present

## 2024-05-28 DIAGNOSIS — Z7985 Long-term (current) use of injectable non-insulin antidiabetic drugs: Secondary | ICD-10-CM | POA: Diagnosis not present

## 2024-05-28 DIAGNOSIS — Z79811 Long term (current) use of aromatase inhibitors: Secondary | ICD-10-CM | POA: Diagnosis not present

## 2024-05-28 DIAGNOSIS — I10 Essential (primary) hypertension: Secondary | ICD-10-CM | POA: Diagnosis not present

## 2024-05-28 DIAGNOSIS — G252 Other specified forms of tremor: Secondary | ICD-10-CM | POA: Diagnosis not present

## 2024-05-28 DIAGNOSIS — G4710 Hypersomnia, unspecified: Secondary | ICD-10-CM | POA: Diagnosis not present

## 2024-05-28 DIAGNOSIS — Z556 Problems related to health literacy: Secondary | ICD-10-CM | POA: Diagnosis not present

## 2024-05-28 DIAGNOSIS — E119 Type 2 diabetes mellitus without complications: Secondary | ICD-10-CM | POA: Diagnosis not present

## 2024-05-29 DIAGNOSIS — E119 Type 2 diabetes mellitus without complications: Secondary | ICD-10-CM | POA: Diagnosis not present

## 2024-05-29 DIAGNOSIS — Z79811 Long term (current) use of aromatase inhibitors: Secondary | ICD-10-CM | POA: Diagnosis not present

## 2024-05-29 DIAGNOSIS — J45909 Unspecified asthma, uncomplicated: Secondary | ICD-10-CM | POA: Diagnosis not present

## 2024-05-29 DIAGNOSIS — I10 Essential (primary) hypertension: Secondary | ICD-10-CM | POA: Diagnosis not present

## 2024-05-29 DIAGNOSIS — Z7951 Long term (current) use of inhaled steroids: Secondary | ICD-10-CM | POA: Diagnosis not present

## 2024-05-29 DIAGNOSIS — G252 Other specified forms of tremor: Secondary | ICD-10-CM | POA: Diagnosis not present

## 2024-05-29 DIAGNOSIS — G4733 Obstructive sleep apnea (adult) (pediatric): Secondary | ICD-10-CM | POA: Diagnosis not present

## 2024-05-29 DIAGNOSIS — Z7985 Long-term (current) use of injectable non-insulin antidiabetic drugs: Secondary | ICD-10-CM | POA: Diagnosis not present

## 2024-05-29 DIAGNOSIS — G248 Other dystonia: Secondary | ICD-10-CM | POA: Diagnosis not present

## 2024-05-29 DIAGNOSIS — M1990 Unspecified osteoarthritis, unspecified site: Secondary | ICD-10-CM | POA: Diagnosis not present

## 2024-05-29 DIAGNOSIS — G4710 Hypersomnia, unspecified: Secondary | ICD-10-CM | POA: Diagnosis not present

## 2024-05-29 DIAGNOSIS — C50919 Malignant neoplasm of unspecified site of unspecified female breast: Secondary | ICD-10-CM | POA: Diagnosis not present

## 2024-05-29 DIAGNOSIS — G20A1 Parkinson's disease without dyskinesia, without mention of fluctuations: Secondary | ICD-10-CM | POA: Diagnosis not present

## 2024-05-29 DIAGNOSIS — Z556 Problems related to health literacy: Secondary | ICD-10-CM | POA: Diagnosis not present

## 2024-06-03 DIAGNOSIS — Z79811 Long term (current) use of aromatase inhibitors: Secondary | ICD-10-CM | POA: Diagnosis not present

## 2024-06-03 DIAGNOSIS — C50919 Malignant neoplasm of unspecified site of unspecified female breast: Secondary | ICD-10-CM | POA: Diagnosis not present

## 2024-06-03 DIAGNOSIS — G252 Other specified forms of tremor: Secondary | ICD-10-CM | POA: Diagnosis not present

## 2024-06-03 DIAGNOSIS — Z7985 Long-term (current) use of injectable non-insulin antidiabetic drugs: Secondary | ICD-10-CM | POA: Diagnosis not present

## 2024-06-03 DIAGNOSIS — E119 Type 2 diabetes mellitus without complications: Secondary | ICD-10-CM | POA: Diagnosis not present

## 2024-06-03 DIAGNOSIS — G4733 Obstructive sleep apnea (adult) (pediatric): Secondary | ICD-10-CM | POA: Diagnosis not present

## 2024-06-03 DIAGNOSIS — I10 Essential (primary) hypertension: Secondary | ICD-10-CM | POA: Diagnosis not present

## 2024-06-03 DIAGNOSIS — M1990 Unspecified osteoarthritis, unspecified site: Secondary | ICD-10-CM | POA: Diagnosis not present

## 2024-06-03 DIAGNOSIS — G20A1 Parkinson's disease without dyskinesia, without mention of fluctuations: Secondary | ICD-10-CM | POA: Diagnosis not present

## 2024-06-03 DIAGNOSIS — Z7951 Long term (current) use of inhaled steroids: Secondary | ICD-10-CM | POA: Diagnosis not present

## 2024-06-03 DIAGNOSIS — G248 Other dystonia: Secondary | ICD-10-CM | POA: Diagnosis not present

## 2024-06-03 DIAGNOSIS — G4710 Hypersomnia, unspecified: Secondary | ICD-10-CM | POA: Diagnosis not present

## 2024-06-03 DIAGNOSIS — J45909 Unspecified asthma, uncomplicated: Secondary | ICD-10-CM | POA: Diagnosis not present

## 2024-06-03 DIAGNOSIS — Z556 Problems related to health literacy: Secondary | ICD-10-CM | POA: Diagnosis not present

## 2024-06-07 DIAGNOSIS — Z7951 Long term (current) use of inhaled steroids: Secondary | ICD-10-CM | POA: Diagnosis not present

## 2024-06-07 DIAGNOSIS — G20A1 Parkinson's disease without dyskinesia, without mention of fluctuations: Secondary | ICD-10-CM | POA: Diagnosis not present

## 2024-06-07 DIAGNOSIS — J45909 Unspecified asthma, uncomplicated: Secondary | ICD-10-CM | POA: Diagnosis not present

## 2024-06-07 DIAGNOSIS — G248 Other dystonia: Secondary | ICD-10-CM | POA: Diagnosis not present

## 2024-06-07 DIAGNOSIS — G4710 Hypersomnia, unspecified: Secondary | ICD-10-CM | POA: Diagnosis not present

## 2024-06-07 DIAGNOSIS — E119 Type 2 diabetes mellitus without complications: Secondary | ICD-10-CM | POA: Diagnosis not present

## 2024-06-07 DIAGNOSIS — I10 Essential (primary) hypertension: Secondary | ICD-10-CM | POA: Diagnosis not present

## 2024-06-07 DIAGNOSIS — C50919 Malignant neoplasm of unspecified site of unspecified female breast: Secondary | ICD-10-CM | POA: Diagnosis not present

## 2024-06-07 DIAGNOSIS — G4733 Obstructive sleep apnea (adult) (pediatric): Secondary | ICD-10-CM | POA: Diagnosis not present

## 2024-06-07 DIAGNOSIS — G252 Other specified forms of tremor: Secondary | ICD-10-CM | POA: Diagnosis not present

## 2024-06-07 DIAGNOSIS — Z556 Problems related to health literacy: Secondary | ICD-10-CM | POA: Diagnosis not present

## 2024-06-07 DIAGNOSIS — M1990 Unspecified osteoarthritis, unspecified site: Secondary | ICD-10-CM | POA: Diagnosis not present

## 2024-06-07 DIAGNOSIS — Z79811 Long term (current) use of aromatase inhibitors: Secondary | ICD-10-CM | POA: Diagnosis not present

## 2024-06-07 DIAGNOSIS — Z7985 Long-term (current) use of injectable non-insulin antidiabetic drugs: Secondary | ICD-10-CM | POA: Diagnosis not present

## 2024-06-09 DIAGNOSIS — R0600 Dyspnea, unspecified: Secondary | ICD-10-CM | POA: Diagnosis not present

## 2024-06-09 DIAGNOSIS — G4733 Obstructive sleep apnea (adult) (pediatric): Secondary | ICD-10-CM | POA: Diagnosis not present

## 2024-06-09 DIAGNOSIS — R400 Somnolence: Secondary | ICD-10-CM | POA: Diagnosis not present

## 2024-06-09 DIAGNOSIS — D638 Anemia in other chronic diseases classified elsewhere: Secondary | ICD-10-CM | POA: Diagnosis not present

## 2024-06-10 DIAGNOSIS — G248 Other dystonia: Secondary | ICD-10-CM | POA: Diagnosis not present

## 2024-06-10 DIAGNOSIS — Z79811 Long term (current) use of aromatase inhibitors: Secondary | ICD-10-CM | POA: Diagnosis not present

## 2024-06-10 DIAGNOSIS — M1990 Unspecified osteoarthritis, unspecified site: Secondary | ICD-10-CM | POA: Diagnosis not present

## 2024-06-10 DIAGNOSIS — G4733 Obstructive sleep apnea (adult) (pediatric): Secondary | ICD-10-CM | POA: Diagnosis not present

## 2024-06-10 DIAGNOSIS — G252 Other specified forms of tremor: Secondary | ICD-10-CM | POA: Diagnosis not present

## 2024-06-10 DIAGNOSIS — G20A1 Parkinson's disease without dyskinesia, without mention of fluctuations: Secondary | ICD-10-CM | POA: Diagnosis not present

## 2024-06-10 DIAGNOSIS — Z556 Problems related to health literacy: Secondary | ICD-10-CM | POA: Diagnosis not present

## 2024-06-10 DIAGNOSIS — I10 Essential (primary) hypertension: Secondary | ICD-10-CM | POA: Diagnosis not present

## 2024-06-10 DIAGNOSIS — C50919 Malignant neoplasm of unspecified site of unspecified female breast: Secondary | ICD-10-CM | POA: Diagnosis not present

## 2024-06-10 DIAGNOSIS — Z7985 Long-term (current) use of injectable non-insulin antidiabetic drugs: Secondary | ICD-10-CM | POA: Diagnosis not present

## 2024-06-10 DIAGNOSIS — G4710 Hypersomnia, unspecified: Secondary | ICD-10-CM | POA: Diagnosis not present

## 2024-06-10 DIAGNOSIS — J45909 Unspecified asthma, uncomplicated: Secondary | ICD-10-CM | POA: Diagnosis not present

## 2024-06-10 DIAGNOSIS — H401131 Primary open-angle glaucoma, bilateral, mild stage: Secondary | ICD-10-CM | POA: Diagnosis not present

## 2024-06-10 DIAGNOSIS — E119 Type 2 diabetes mellitus without complications: Secondary | ICD-10-CM | POA: Diagnosis not present

## 2024-06-10 DIAGNOSIS — Z7951 Long term (current) use of inhaled steroids: Secondary | ICD-10-CM | POA: Diagnosis not present

## 2024-06-15 ENCOUNTER — Other Ambulatory Visit: Payer: Self-pay | Admitting: Hematology and Oncology

## 2024-06-15 DIAGNOSIS — E118 Type 2 diabetes mellitus with unspecified complications: Secondary | ICD-10-CM | POA: Diagnosis not present

## 2024-06-15 DIAGNOSIS — Z0000 Encounter for general adult medical examination without abnormal findings: Secondary | ICD-10-CM | POA: Diagnosis not present

## 2024-06-15 DIAGNOSIS — Z170 Estrogen receptor positive status [ER+]: Secondary | ICD-10-CM

## 2024-06-15 DIAGNOSIS — Z79899 Other long term (current) drug therapy: Secondary | ICD-10-CM | POA: Diagnosis not present

## 2024-07-02 DIAGNOSIS — Z79811 Long term (current) use of aromatase inhibitors: Secondary | ICD-10-CM | POA: Diagnosis not present

## 2024-07-02 DIAGNOSIS — C50919 Malignant neoplasm of unspecified site of unspecified female breast: Secondary | ICD-10-CM | POA: Diagnosis not present

## 2024-07-02 DIAGNOSIS — I10 Essential (primary) hypertension: Secondary | ICD-10-CM | POA: Diagnosis not present

## 2024-07-02 DIAGNOSIS — G248 Other dystonia: Secondary | ICD-10-CM | POA: Diagnosis not present

## 2024-07-02 DIAGNOSIS — M1990 Unspecified osteoarthritis, unspecified site: Secondary | ICD-10-CM | POA: Diagnosis not present

## 2024-07-02 DIAGNOSIS — E119 Type 2 diabetes mellitus without complications: Secondary | ICD-10-CM | POA: Diagnosis not present

## 2024-07-02 DIAGNOSIS — G4710 Hypersomnia, unspecified: Secondary | ICD-10-CM | POA: Diagnosis not present

## 2024-07-02 DIAGNOSIS — G4733 Obstructive sleep apnea (adult) (pediatric): Secondary | ICD-10-CM | POA: Diagnosis not present

## 2024-07-02 DIAGNOSIS — G252 Other specified forms of tremor: Secondary | ICD-10-CM | POA: Diagnosis not present

## 2024-07-02 DIAGNOSIS — J45909 Unspecified asthma, uncomplicated: Secondary | ICD-10-CM | POA: Diagnosis not present

## 2024-07-02 DIAGNOSIS — Z7985 Long-term (current) use of injectable non-insulin antidiabetic drugs: Secondary | ICD-10-CM | POA: Diagnosis not present

## 2024-07-02 DIAGNOSIS — G20A1 Parkinson's disease without dyskinesia, without mention of fluctuations: Secondary | ICD-10-CM | POA: Diagnosis not present

## 2024-07-02 DIAGNOSIS — Z7951 Long term (current) use of inhaled steroids: Secondary | ICD-10-CM | POA: Diagnosis not present

## 2024-07-02 DIAGNOSIS — Z556 Problems related to health literacy: Secondary | ICD-10-CM | POA: Diagnosis not present

## 2024-07-07 DIAGNOSIS — J45909 Unspecified asthma, uncomplicated: Secondary | ICD-10-CM | POA: Diagnosis not present

## 2024-07-07 DIAGNOSIS — C50919 Malignant neoplasm of unspecified site of unspecified female breast: Secondary | ICD-10-CM | POA: Diagnosis not present

## 2024-07-07 DIAGNOSIS — G4710 Hypersomnia, unspecified: Secondary | ICD-10-CM | POA: Diagnosis not present

## 2024-07-07 DIAGNOSIS — Z79811 Long term (current) use of aromatase inhibitors: Secondary | ICD-10-CM | POA: Diagnosis not present

## 2024-07-07 DIAGNOSIS — E119 Type 2 diabetes mellitus without complications: Secondary | ICD-10-CM | POA: Diagnosis not present

## 2024-07-07 DIAGNOSIS — I10 Essential (primary) hypertension: Secondary | ICD-10-CM | POA: Diagnosis not present

## 2024-07-07 DIAGNOSIS — Z556 Problems related to health literacy: Secondary | ICD-10-CM | POA: Diagnosis not present

## 2024-07-07 DIAGNOSIS — G248 Other dystonia: Secondary | ICD-10-CM | POA: Diagnosis not present

## 2024-07-07 DIAGNOSIS — Z7985 Long-term (current) use of injectable non-insulin antidiabetic drugs: Secondary | ICD-10-CM | POA: Diagnosis not present

## 2024-07-07 DIAGNOSIS — Z7951 Long term (current) use of inhaled steroids: Secondary | ICD-10-CM | POA: Diagnosis not present

## 2024-07-07 DIAGNOSIS — G252 Other specified forms of tremor: Secondary | ICD-10-CM | POA: Diagnosis not present

## 2024-07-07 DIAGNOSIS — M1990 Unspecified osteoarthritis, unspecified site: Secondary | ICD-10-CM | POA: Diagnosis not present

## 2024-07-07 DIAGNOSIS — G4733 Obstructive sleep apnea (adult) (pediatric): Secondary | ICD-10-CM | POA: Diagnosis not present

## 2024-07-08 DIAGNOSIS — G248 Other dystonia: Secondary | ICD-10-CM | POA: Diagnosis not present

## 2024-07-08 DIAGNOSIS — G4733 Obstructive sleep apnea (adult) (pediatric): Secondary | ICD-10-CM | POA: Diagnosis not present

## 2024-07-08 DIAGNOSIS — G252 Other specified forms of tremor: Secondary | ICD-10-CM | POA: Diagnosis not present

## 2024-07-08 DIAGNOSIS — G4710 Hypersomnia, unspecified: Secondary | ICD-10-CM | POA: Diagnosis not present

## 2024-07-08 DIAGNOSIS — C50919 Malignant neoplasm of unspecified site of unspecified female breast: Secondary | ICD-10-CM | POA: Diagnosis not present

## 2024-07-08 DIAGNOSIS — E119 Type 2 diabetes mellitus without complications: Secondary | ICD-10-CM | POA: Diagnosis not present

## 2024-07-08 DIAGNOSIS — Z7951 Long term (current) use of inhaled steroids: Secondary | ICD-10-CM | POA: Diagnosis not present

## 2024-07-08 DIAGNOSIS — I10 Essential (primary) hypertension: Secondary | ICD-10-CM | POA: Diagnosis not present

## 2024-07-08 DIAGNOSIS — J45909 Unspecified asthma, uncomplicated: Secondary | ICD-10-CM | POA: Diagnosis not present

## 2024-07-08 DIAGNOSIS — Z79811 Long term (current) use of aromatase inhibitors: Secondary | ICD-10-CM | POA: Diagnosis not present

## 2024-07-08 DIAGNOSIS — Z7985 Long-term (current) use of injectable non-insulin antidiabetic drugs: Secondary | ICD-10-CM | POA: Diagnosis not present

## 2024-07-08 DIAGNOSIS — M1990 Unspecified osteoarthritis, unspecified site: Secondary | ICD-10-CM | POA: Diagnosis not present

## 2024-07-08 DIAGNOSIS — G20A1 Parkinson's disease without dyskinesia, without mention of fluctuations: Secondary | ICD-10-CM | POA: Diagnosis not present

## 2024-07-08 DIAGNOSIS — Z556 Problems related to health literacy: Secondary | ICD-10-CM | POA: Diagnosis not present

## 2024-07-10 DIAGNOSIS — E119 Type 2 diabetes mellitus without complications: Secondary | ICD-10-CM | POA: Diagnosis not present

## 2024-07-10 DIAGNOSIS — G248 Other dystonia: Secondary | ICD-10-CM | POA: Diagnosis not present

## 2024-07-10 DIAGNOSIS — Z7951 Long term (current) use of inhaled steroids: Secondary | ICD-10-CM | POA: Diagnosis not present

## 2024-07-10 DIAGNOSIS — Z7985 Long-term (current) use of injectable non-insulin antidiabetic drugs: Secondary | ICD-10-CM | POA: Diagnosis not present

## 2024-07-10 DIAGNOSIS — Z79811 Long term (current) use of aromatase inhibitors: Secondary | ICD-10-CM | POA: Diagnosis not present

## 2024-07-10 DIAGNOSIS — G252 Other specified forms of tremor: Secondary | ICD-10-CM | POA: Diagnosis not present

## 2024-07-10 DIAGNOSIS — J45909 Unspecified asthma, uncomplicated: Secondary | ICD-10-CM | POA: Diagnosis not present

## 2024-07-10 DIAGNOSIS — Z556 Problems related to health literacy: Secondary | ICD-10-CM | POA: Diagnosis not present

## 2024-07-10 DIAGNOSIS — I10 Essential (primary) hypertension: Secondary | ICD-10-CM | POA: Diagnosis not present

## 2024-07-10 DIAGNOSIS — G4710 Hypersomnia, unspecified: Secondary | ICD-10-CM | POA: Diagnosis not present

## 2024-07-10 DIAGNOSIS — M1990 Unspecified osteoarthritis, unspecified site: Secondary | ICD-10-CM | POA: Diagnosis not present

## 2024-07-10 DIAGNOSIS — C50919 Malignant neoplasm of unspecified site of unspecified female breast: Secondary | ICD-10-CM | POA: Diagnosis not present

## 2024-07-10 DIAGNOSIS — G4733 Obstructive sleep apnea (adult) (pediatric): Secondary | ICD-10-CM | POA: Diagnosis not present

## 2024-07-10 DIAGNOSIS — G20A1 Parkinson's disease without dyskinesia, without mention of fluctuations: Secondary | ICD-10-CM | POA: Diagnosis not present

## 2024-07-14 DIAGNOSIS — I10 Essential (primary) hypertension: Secondary | ICD-10-CM | POA: Diagnosis not present

## 2024-07-14 DIAGNOSIS — G4733 Obstructive sleep apnea (adult) (pediatric): Secondary | ICD-10-CM | POA: Diagnosis not present

## 2024-07-14 DIAGNOSIS — Z7951 Long term (current) use of inhaled steroids: Secondary | ICD-10-CM | POA: Diagnosis not present

## 2024-07-14 DIAGNOSIS — G252 Other specified forms of tremor: Secondary | ICD-10-CM | POA: Diagnosis not present

## 2024-07-14 DIAGNOSIS — Z556 Problems related to health literacy: Secondary | ICD-10-CM | POA: Diagnosis not present

## 2024-07-14 DIAGNOSIS — Z7985 Long-term (current) use of injectable non-insulin antidiabetic drugs: Secondary | ICD-10-CM | POA: Diagnosis not present

## 2024-07-14 DIAGNOSIS — Z79811 Long term (current) use of aromatase inhibitors: Secondary | ICD-10-CM | POA: Diagnosis not present

## 2024-07-14 DIAGNOSIS — G248 Other dystonia: Secondary | ICD-10-CM | POA: Diagnosis not present

## 2024-07-14 DIAGNOSIS — J45909 Unspecified asthma, uncomplicated: Secondary | ICD-10-CM | POA: Diagnosis not present

## 2024-07-14 DIAGNOSIS — G20A1 Parkinson's disease without dyskinesia, without mention of fluctuations: Secondary | ICD-10-CM | POA: Diagnosis not present

## 2024-07-14 DIAGNOSIS — E119 Type 2 diabetes mellitus without complications: Secondary | ICD-10-CM | POA: Diagnosis not present

## 2024-07-14 DIAGNOSIS — C50919 Malignant neoplasm of unspecified site of unspecified female breast: Secondary | ICD-10-CM | POA: Diagnosis not present

## 2024-07-14 DIAGNOSIS — G243 Spasmodic torticollis: Secondary | ICD-10-CM | POA: Diagnosis not present

## 2024-07-14 DIAGNOSIS — G249 Dystonia, unspecified: Secondary | ICD-10-CM | POA: Diagnosis not present

## 2024-07-14 DIAGNOSIS — G4710 Hypersomnia, unspecified: Secondary | ICD-10-CM | POA: Diagnosis not present

## 2024-07-14 DIAGNOSIS — M1990 Unspecified osteoarthritis, unspecified site: Secondary | ICD-10-CM | POA: Diagnosis not present

## 2024-07-15 DIAGNOSIS — N1832 Chronic kidney disease, stage 3b: Secondary | ICD-10-CM | POA: Diagnosis not present

## 2024-07-16 DIAGNOSIS — I10 Essential (primary) hypertension: Secondary | ICD-10-CM | POA: Diagnosis not present

## 2024-07-16 DIAGNOSIS — E119 Type 2 diabetes mellitus without complications: Secondary | ICD-10-CM | POA: Diagnosis not present

## 2024-07-16 DIAGNOSIS — Z7985 Long-term (current) use of injectable non-insulin antidiabetic drugs: Secondary | ICD-10-CM | POA: Diagnosis not present

## 2024-07-16 DIAGNOSIS — C50919 Malignant neoplasm of unspecified site of unspecified female breast: Secondary | ICD-10-CM | POA: Diagnosis not present

## 2024-07-16 DIAGNOSIS — M1990 Unspecified osteoarthritis, unspecified site: Secondary | ICD-10-CM | POA: Diagnosis not present

## 2024-07-16 DIAGNOSIS — J45909 Unspecified asthma, uncomplicated: Secondary | ICD-10-CM | POA: Diagnosis not present

## 2024-07-16 DIAGNOSIS — Z79811 Long term (current) use of aromatase inhibitors: Secondary | ICD-10-CM | POA: Diagnosis not present

## 2024-07-16 DIAGNOSIS — Z556 Problems related to health literacy: Secondary | ICD-10-CM | POA: Diagnosis not present

## 2024-07-16 DIAGNOSIS — G252 Other specified forms of tremor: Secondary | ICD-10-CM | POA: Diagnosis not present

## 2024-07-16 DIAGNOSIS — G4733 Obstructive sleep apnea (adult) (pediatric): Secondary | ICD-10-CM | POA: Diagnosis not present

## 2024-07-16 DIAGNOSIS — G20A1 Parkinson's disease without dyskinesia, without mention of fluctuations: Secondary | ICD-10-CM | POA: Diagnosis not present

## 2024-07-16 DIAGNOSIS — G248 Other dystonia: Secondary | ICD-10-CM | POA: Diagnosis not present

## 2024-07-16 DIAGNOSIS — G4710 Hypersomnia, unspecified: Secondary | ICD-10-CM | POA: Diagnosis not present

## 2024-07-16 DIAGNOSIS — Z7951 Long term (current) use of inhaled steroids: Secondary | ICD-10-CM | POA: Diagnosis not present

## 2024-07-21 DIAGNOSIS — L602 Onychogryphosis: Secondary | ICD-10-CM | POA: Diagnosis not present

## 2024-07-21 DIAGNOSIS — Z7985 Long-term (current) use of injectable non-insulin antidiabetic drugs: Secondary | ICD-10-CM | POA: Diagnosis not present

## 2024-07-21 DIAGNOSIS — E119 Type 2 diabetes mellitus without complications: Secondary | ICD-10-CM | POA: Diagnosis not present

## 2024-07-21 DIAGNOSIS — M1990 Unspecified osteoarthritis, unspecified site: Secondary | ICD-10-CM | POA: Diagnosis not present

## 2024-07-21 DIAGNOSIS — G252 Other specified forms of tremor: Secondary | ICD-10-CM | POA: Diagnosis not present

## 2024-07-21 DIAGNOSIS — Z79811 Long term (current) use of aromatase inhibitors: Secondary | ICD-10-CM | POA: Diagnosis not present

## 2024-07-21 DIAGNOSIS — G4733 Obstructive sleep apnea (adult) (pediatric): Secondary | ICD-10-CM | POA: Diagnosis not present

## 2024-07-21 DIAGNOSIS — J45909 Unspecified asthma, uncomplicated: Secondary | ICD-10-CM | POA: Diagnosis not present

## 2024-07-21 DIAGNOSIS — Z556 Problems related to health literacy: Secondary | ICD-10-CM | POA: Diagnosis not present

## 2024-07-21 DIAGNOSIS — G20A1 Parkinson's disease without dyskinesia, without mention of fluctuations: Secondary | ICD-10-CM | POA: Diagnosis not present

## 2024-07-21 DIAGNOSIS — I10 Essential (primary) hypertension: Secondary | ICD-10-CM | POA: Diagnosis not present

## 2024-07-21 DIAGNOSIS — C50919 Malignant neoplasm of unspecified site of unspecified female breast: Secondary | ICD-10-CM | POA: Diagnosis not present

## 2024-07-21 DIAGNOSIS — G4710 Hypersomnia, unspecified: Secondary | ICD-10-CM | POA: Diagnosis not present

## 2024-07-21 DIAGNOSIS — Z7951 Long term (current) use of inhaled steroids: Secondary | ICD-10-CM | POA: Diagnosis not present

## 2024-07-21 DIAGNOSIS — G248 Other dystonia: Secondary | ICD-10-CM | POA: Diagnosis not present

## 2024-07-22 DIAGNOSIS — G252 Other specified forms of tremor: Secondary | ICD-10-CM | POA: Diagnosis not present

## 2024-07-22 DIAGNOSIS — G20A1 Parkinson's disease without dyskinesia, without mention of fluctuations: Secondary | ICD-10-CM | POA: Diagnosis not present

## 2024-07-22 DIAGNOSIS — Z7951 Long term (current) use of inhaled steroids: Secondary | ICD-10-CM | POA: Diagnosis not present

## 2024-07-22 DIAGNOSIS — Z7985 Long-term (current) use of injectable non-insulin antidiabetic drugs: Secondary | ICD-10-CM | POA: Diagnosis not present

## 2024-07-22 DIAGNOSIS — G4733 Obstructive sleep apnea (adult) (pediatric): Secondary | ICD-10-CM | POA: Diagnosis not present

## 2024-07-22 DIAGNOSIS — Z556 Problems related to health literacy: Secondary | ICD-10-CM | POA: Diagnosis not present

## 2024-07-22 DIAGNOSIS — I10 Essential (primary) hypertension: Secondary | ICD-10-CM | POA: Diagnosis not present

## 2024-07-22 DIAGNOSIS — C50919 Malignant neoplasm of unspecified site of unspecified female breast: Secondary | ICD-10-CM | POA: Diagnosis not present

## 2024-07-22 DIAGNOSIS — G248 Other dystonia: Secondary | ICD-10-CM | POA: Diagnosis not present

## 2024-07-22 DIAGNOSIS — Z79811 Long term (current) use of aromatase inhibitors: Secondary | ICD-10-CM | POA: Diagnosis not present

## 2024-07-22 DIAGNOSIS — J45909 Unspecified asthma, uncomplicated: Secondary | ICD-10-CM | POA: Diagnosis not present

## 2024-07-22 DIAGNOSIS — M1990 Unspecified osteoarthritis, unspecified site: Secondary | ICD-10-CM | POA: Diagnosis not present

## 2024-07-22 DIAGNOSIS — E119 Type 2 diabetes mellitus without complications: Secondary | ICD-10-CM | POA: Diagnosis not present

## 2024-07-22 DIAGNOSIS — G4710 Hypersomnia, unspecified: Secondary | ICD-10-CM | POA: Diagnosis not present

## 2024-07-27 DIAGNOSIS — G248 Other dystonia: Secondary | ICD-10-CM | POA: Diagnosis not present

## 2024-07-27 DIAGNOSIS — Z556 Problems related to health literacy: Secondary | ICD-10-CM | POA: Diagnosis not present

## 2024-07-27 DIAGNOSIS — R5383 Other fatigue: Secondary | ICD-10-CM | POA: Diagnosis not present

## 2024-07-27 DIAGNOSIS — Z7985 Long-term (current) use of injectable non-insulin antidiabetic drugs: Secondary | ICD-10-CM | POA: Diagnosis not present

## 2024-07-27 DIAGNOSIS — E119 Type 2 diabetes mellitus without complications: Secondary | ICD-10-CM | POA: Diagnosis not present

## 2024-07-27 DIAGNOSIS — G252 Other specified forms of tremor: Secondary | ICD-10-CM | POA: Diagnosis not present

## 2024-07-27 DIAGNOSIS — J45909 Unspecified asthma, uncomplicated: Secondary | ICD-10-CM | POA: Diagnosis not present

## 2024-07-27 DIAGNOSIS — G4733 Obstructive sleep apnea (adult) (pediatric): Secondary | ICD-10-CM | POA: Diagnosis not present

## 2024-07-27 DIAGNOSIS — Z7951 Long term (current) use of inhaled steroids: Secondary | ICD-10-CM | POA: Diagnosis not present

## 2024-07-27 DIAGNOSIS — I10 Essential (primary) hypertension: Secondary | ICD-10-CM | POA: Diagnosis not present

## 2024-07-27 DIAGNOSIS — G4710 Hypersomnia, unspecified: Secondary | ICD-10-CM | POA: Diagnosis not present

## 2024-07-27 DIAGNOSIS — R0600 Dyspnea, unspecified: Secondary | ICD-10-CM | POA: Diagnosis not present

## 2024-07-27 DIAGNOSIS — Z79811 Long term (current) use of aromatase inhibitors: Secondary | ICD-10-CM | POA: Diagnosis not present

## 2024-07-27 DIAGNOSIS — R400 Somnolence: Secondary | ICD-10-CM | POA: Diagnosis not present

## 2024-07-27 DIAGNOSIS — M1990 Unspecified osteoarthritis, unspecified site: Secondary | ICD-10-CM | POA: Diagnosis not present

## 2024-07-27 DIAGNOSIS — G20A1 Parkinson's disease without dyskinesia, without mention of fluctuations: Secondary | ICD-10-CM | POA: Diagnosis not present

## 2024-07-27 DIAGNOSIS — C50919 Malignant neoplasm of unspecified site of unspecified female breast: Secondary | ICD-10-CM | POA: Diagnosis not present

## 2024-09-21 NOTE — Progress Notes (Signed)
 MAKALEY STORTS                                          MRN: [id number]   09/21/2024   The [facility] Quality Team Specialist reviewed this patient medical record for the purposes of chart review for care gap closure. The following were reviewed: abstraction for care gap closure-kidney health evaluation for diabetes:eGFR  and uACR.    [facility] Quality Team

## 2024-11-18 ENCOUNTER — Ambulatory Visit (INDEPENDENT_AMBULATORY_CARE_PROVIDER_SITE_OTHER)
Admission: RE | Admit: 2024-11-18 | Discharge: 2024-11-18 | Disposition: A | Discharge status: Home / Self Care | Source: Ambulatory Visit | Attending: Hematology and Oncology | Admitting: Hematology and Oncology

## 2024-11-18 DIAGNOSIS — Z1231 Encounter for screening mammogram for malignant neoplasm of breast: Secondary | ICD-10-CM

## 2024-11-18 DIAGNOSIS — Z170 Estrogen receptor positive status [ER+]: Secondary | ICD-10-CM

## 2024-11-24 ENCOUNTER — Inpatient Hospital Stay: Payer: Self-pay | Admitting: Oncology

## 2024-11-25 ENCOUNTER — Ambulatory Visit: Payer: Medicare Other | Admitting: Oncology

## 2024-11-27 ENCOUNTER — Inpatient Hospital Stay: Admitting: Hematology and Oncology

## 2024-12-10 ENCOUNTER — Inpatient Hospital Stay: Admitting: Oncology
